# Patient Record
Sex: Male | Born: 1955 | Race: Black or African American | Hispanic: No | Marital: Married | Smoking: Current every day smoker
Health system: Southern US, Community
[De-identification: ages and names within clinical notes are randomized; demographics above are authoritative.]

## PROBLEM LIST (undated history)

## (undated) DIAGNOSIS — F209 Schizophrenia, unspecified: Secondary | ICD-10-CM

## (undated) DIAGNOSIS — F32A Depression, unspecified: Secondary | ICD-10-CM

## (undated) DIAGNOSIS — F329 Major depressive disorder, single episode, unspecified: Secondary | ICD-10-CM

## (undated) DIAGNOSIS — T148XXA Other injury of unspecified body region, initial encounter: Secondary | ICD-10-CM

## (undated) HISTORY — PX: INCISE AND DRAIN ABCESS: PRO64

---

## 2001-03-05 ENCOUNTER — Emergency Department (HOSPITAL_COMMUNITY): Admission: EM | Admit: 2001-03-05 | Discharge: 2001-03-05 | Payer: Self-pay | Admitting: *Deleted

## 2001-09-22 ENCOUNTER — Encounter: Payer: Self-pay | Admitting: Emergency Medicine

## 2001-09-22 ENCOUNTER — Emergency Department (HOSPITAL_COMMUNITY): Admission: EM | Admit: 2001-09-22 | Discharge: 2001-09-22 | Payer: Self-pay | Admitting: Emergency Medicine

## 2003-09-01 ENCOUNTER — Emergency Department (HOSPITAL_COMMUNITY): Admission: EM | Admit: 2003-09-01 | Discharge: 2003-09-01 | Payer: Self-pay | Admitting: Emergency Medicine

## 2003-09-05 ENCOUNTER — Ambulatory Visit (HOSPITAL_COMMUNITY): Admission: RE | Admit: 2003-09-05 | Discharge: 2003-09-05 | Payer: Self-pay | Admitting: Orthopaedic Surgery

## 2004-08-01 ENCOUNTER — Ambulatory Visit (HOSPITAL_COMMUNITY): Admission: RE | Admit: 2004-08-01 | Discharge: 2004-08-01 | Payer: Self-pay | Admitting: General Surgery

## 2008-09-14 ENCOUNTER — Emergency Department (HOSPITAL_COMMUNITY): Admission: EM | Admit: 2008-09-14 | Discharge: 2008-09-14 | Payer: Self-pay | Admitting: Emergency Medicine

## 2009-12-28 ENCOUNTER — Emergency Department (HOSPITAL_COMMUNITY): Admission: EM | Admit: 2009-12-28 | Discharge: 2009-12-28 | Payer: Self-pay | Admitting: Emergency Medicine

## 2010-02-15 ENCOUNTER — Emergency Department (HOSPITAL_COMMUNITY): Admission: EM | Admit: 2010-02-15 | Discharge: 2010-02-15 | Payer: Self-pay | Admitting: Emergency Medicine

## 2010-08-14 LAB — URINALYSIS, ROUTINE W REFLEX MICROSCOPIC
Glucose, UA: NEGATIVE mg/dL
Ketones, ur: 15 mg/dL — AB
Nitrite: NEGATIVE
Protein, ur: NEGATIVE mg/dL
Specific Gravity, Urine: 1.025 (ref 1.005–1.030)
Urobilinogen, UA: 2 mg/dL — ABNORMAL HIGH (ref 0.0–1.0)
pH: 5.5 (ref 5.0–8.0)

## 2010-10-17 NOTE — H&P (Signed)
NAME:  Nathaniel Park, Nathaniel Park                 ACCOUNT NO.:  [id number]   MEDICAL RECORD NO.:  [id number]          PATIENT TYPE:  AMB   LOCATION:  DAY                           FACILITY:  [facility]   PHYSICIAN:  Dirk Dress. Katrinka Blazing, M.D.   DATE OF BIRTH:  [birth year]   DATE OF ADMISSION:  DATE OF DISCHARGE:  LH                                HISTORY & PHYSICAL   HISTORY OF PRESENT ILLNESS:  A 55 year old male with a history of recurrent  perineal abscess.  The patient initially presented in early September of  2005 with a complaint of a chronic abscess of the perineum for greater than  1 year.  He had had incision and drainage x3.  He has been treated with  antibiotics.  The patient was advised to return to the office when the  office became inflamed.  He now presents with a 2-day history of a pointing  anterior perirectal abscess with extension to the median raphe of the base  of the penis.   PAST HISTORY:  He has no chronic illnesses.   MEDICATIONS:  Zyprexa 7.5 mg daily.   PAST SURGICAL HISTORY:  No prior surgery, except for local I&D.   SOCIAL HISTORY:  He is employed.  He smokes one pack of cigarettes per day.  He does not drink.  He does not use drugs.   PHYSICAL EXAMINATION:  VITAL SIGNS:  Blood pressure 110/70, pulse 72,  respirations 18, weight 180 pounds.  HEENT:  Unremarkable.  NECK:  Supple.  No JVD, bruit, adenopathy, or thyromegaly.  CHEST:  Clear to auscultation.  HEART:  Regular rate and rhythm without murmur, rub, or gallop.  ABDOMEN:  Soft, nontender.  No masses.  EXTREMITIES:  No clubbing, cyanosis, or edema.  RECTAL:  A large anterior perirectal abscess to the left of the midline,  extending to the base of the penis.  NEUROLOGIC:  No focal deficit.   IMPRESSION:  Anterior perirectal abscess.   PLAN:  The patient has been started on oral doxycycline.  He will had a wide  excision and drainage of the area under anesthesia, and will be continued on  oral doxycycline as an  outpatient.      LCS/MEDQ  D:  07/31/2004  T:  08/01/2004  Job:  [id number]   cc:   [facility] Short Stay [facility]

## 2010-10-17 NOTE — Op Note (Signed)
NAME:  JOSEJULIAN, TARANGO                 ACCOUNT NO.:  [id number]   MEDICAL RECORD NO.:  [id number]          PATIENT TYPE:  AMB   LOCATION:  DAY                           FACILITY:  [facility]   PHYSICIAN:  Dirk Dress. Katrinka Blazing, M.D.   DATE OF BIRTH:  1956/01/24   DATE OF PROCEDURE:  08/01/2004  DATE OF DISCHARGE:  08/01/2004                                 OPERATIVE REPORT   PREOPERATIVE DIAGNOSIS:  Perirectal abscess.   POSTOPERATIVE DIAGNOSIS:  Perirectal abscess.   PROCEDURE:  Wide excision and drainage of perirectal abscess.   SURGEON:  Elpidio Anis, M.D.   DESCRIPTION:  Under general anesthesia, the patient was positioned in high  lithotomy position.  The perirectal area and the genitalia were prepped and  draped in sterile field.  Incision was made in the abscess cavity, and  cultures were taken.  A wide elliptical incision was made around the large  perirectal abscess with the intention of fully unroofing be an abscess  cavity.  The inflammatory process extended from the anus anteriorly down to  the base of the scrotum.  All of this tissue was excised.  Finger  fractionation of some loculations was carried out.  The area was irrigated  and was then left open.  Dressing was placed.  The patient was placed in the supine position.  He was awakened without  difficulty and was then transferred to a stretcher and taken to the  postanesthetic care unit for monitoring.      LCS/MEDQ  D:  08/31/2004  T:  09/01/2004  Job:  [id number]

## 2014-08-20 ENCOUNTER — Other Ambulatory Visit (HOSPITAL_COMMUNITY): Payer: Self-pay | Admitting: Family Medicine

## 2014-08-20 DIAGNOSIS — M7989 Other specified soft tissue disorders: Secondary | ICD-10-CM

## 2014-08-21 ENCOUNTER — Ambulatory Visit (HOSPITAL_COMMUNITY)
Admission: RE | Admit: 2014-08-21 | Discharge: 2014-08-21 | Disposition: A | Discharge status: Home / Self Care | Payer: BLUE CROSS/BLUE SHIELD | Source: Ambulatory Visit | Attending: Family Medicine | Admitting: Family Medicine

## 2014-08-21 DIAGNOSIS — M7989 Other specified soft tissue disorders: Secondary | ICD-10-CM

## 2014-08-21 DIAGNOSIS — R220 Localized swelling, mass and lump, head: Secondary | ICD-10-CM | POA: Insufficient documentation

## 2018-04-15 ENCOUNTER — Encounter (HOSPITAL_COMMUNITY): Payer: Self-pay | Admitting: Emergency Medicine

## 2018-04-15 ENCOUNTER — Emergency Department (HOSPITAL_COMMUNITY)
Admission: EM | Admit: 2018-04-15 | Discharge: 2018-04-15 | Disposition: A | Discharge status: Home / Self Care | Payer: BLUE CROSS/BLUE SHIELD | Source: Home / Self Care | Attending: Emergency Medicine | Admitting: Emergency Medicine

## 2018-04-15 ENCOUNTER — Other Ambulatory Visit: Payer: Self-pay

## 2018-04-15 ENCOUNTER — Encounter (HOSPITAL_COMMUNITY): Payer: Self-pay

## 2018-04-15 ENCOUNTER — Inpatient Hospital Stay (HOSPITAL_COMMUNITY)
Admission: AD | Admit: 2018-04-15 | Discharge: 2018-04-19 | DRG: 885 | Disposition: A | Discharge status: Home / Self Care | Payer: BLUE CROSS/BLUE SHIELD | Source: Intra-hospital | Attending: Psychiatry | Admitting: Psychiatry

## 2018-04-15 DIAGNOSIS — F209 Schizophrenia, unspecified: Secondary | ICD-10-CM | POA: Diagnosis present

## 2018-04-15 DIAGNOSIS — F419 Anxiety disorder, unspecified: Secondary | ICD-10-CM | POA: Diagnosis not present

## 2018-04-15 DIAGNOSIS — R45851 Suicidal ideations: Secondary | ICD-10-CM | POA: Insufficient documentation

## 2018-04-15 DIAGNOSIS — F329 Major depressive disorder, single episode, unspecified: Secondary | ICD-10-CM | POA: Diagnosis present

## 2018-04-15 DIAGNOSIS — F331 Major depressive disorder, recurrent, moderate: Secondary | ICD-10-CM

## 2018-04-15 DIAGNOSIS — F32A Depression, unspecified: Secondary | ICD-10-CM

## 2018-04-15 DIAGNOSIS — F322 Major depressive disorder, single episode, severe without psychotic features: Secondary | ICD-10-CM | POA: Diagnosis not present

## 2018-04-15 DIAGNOSIS — F17200 Nicotine dependence, unspecified, uncomplicated: Secondary | ICD-10-CM

## 2018-04-15 DIAGNOSIS — G4700 Insomnia, unspecified: Secondary | ICD-10-CM | POA: Diagnosis present

## 2018-04-15 DIAGNOSIS — Z79899 Other long term (current) drug therapy: Secondary | ICD-10-CM | POA: Diagnosis not present

## 2018-04-15 DIAGNOSIS — F200 Paranoid schizophrenia: Secondary | ICD-10-CM | POA: Diagnosis present

## 2018-04-15 DIAGNOSIS — Z23 Encounter for immunization: Secondary | ICD-10-CM | POA: Diagnosis not present

## 2018-04-15 HISTORY — DX: Depression, unspecified: F32.A

## 2018-04-15 HISTORY — DX: Major depressive disorder, single episode, unspecified: F32.9

## 2018-04-15 LAB — COMPREHENSIVE METABOLIC PANEL
ALBUMIN: 4 g/dL (ref 3.5–5.0)
ALK PHOS: 71 U/L (ref 38–126)
ALT: 15 U/L (ref 0–44)
ANION GAP: 7 (ref 5–15)
AST: 19 U/L (ref 15–41)
BILIRUBIN TOTAL: 0.7 mg/dL (ref 0.3–1.2)
BUN: 8 mg/dL (ref 6–20)
CALCIUM: 8.9 mg/dL (ref 8.9–10.3)
CO2: 25 mmol/L (ref 22–32)
Chloride: 108 mmol/L (ref 98–111)
Creatinine, Ser: 1.15 mg/dL (ref 0.61–1.24)
GFR calc Af Amer: >60 mL/min (ref >60)
GFR calc non Af Amer: >60 mL/min (ref >60)
GLUCOSE: 87 mg/dL (ref 70–99)
POTASSIUM: 3.5 mmol/L (ref 3.5–5.1)
Sodium: 140 mmol/L (ref 135–145)
TOTAL PROTEIN: 7.3 g/dL (ref 6.5–8.1)

## 2018-04-15 LAB — CBC
HCT: 46 % (ref 39.0–52.0)
Hemoglobin: 15 g/dL (ref 13.0–17.0)
MCH: 32.3 pg (ref 26.0–34.0)
MCHC: 32.6 g/dL (ref 30.0–36.0)
MCV: 98.9 fL (ref 80.0–100.0)
PLATELETS: 175 10*3/uL (ref 150–400)
RBC: 4.65 MIL/uL (ref 4.22–5.81)
RDW: 14.7 % (ref 11.5–15.5)
WBC: 8.3 10*3/uL (ref 4.0–10.5)
nRBC: 0 % (ref 0.0–0.2)

## 2018-04-15 LAB — ETHANOL

## 2018-04-15 LAB — RAPID URINE DRUG SCREEN, HOSP PERFORMED
Amphetamines: NOT DETECTED
Barbiturates: NOT DETECTED
Benzodiazepines: NOT DETECTED
Cocaine: NOT DETECTED
Opiates: NOT DETECTED
Tetrahydrocannabinol: NOT DETECTED

## 2018-04-15 LAB — SALICYLATE LEVEL

## 2018-04-15 LAB — ACETAMINOPHEN LEVEL

## 2018-04-15 MED ORDER — HYDROXYZINE HCL 25 MG PO TABS: 25.0000 mg | ORAL_TABLET | Freq: Three times a day (TID) | ORAL | Status: DC | PRN

## 2018-04-15 MED ORDER — TRAZODONE HCL 50 MG PO TABS: 50.0000 mg | ORAL_TABLET | Freq: Every evening | ORAL | Status: DC | PRN

## 2018-04-15 MED ORDER — MAGNESIUM HYDROXIDE 400 MG/5ML PO SUSP: 30.0000 mL | Freq: Every day | ORAL | Status: DC | PRN

## 2018-04-15 MED ORDER — ALUM & MAG HYDROXIDE-SIMETH 200-200-20 MG/5ML PO SUSP: 30.0000 mL | ORAL | Status: DC | PRN

## 2018-04-15 MED ORDER — PERPHENAZINE 16 MG PO TABS
16.0000 mg | ORAL_TABLET | Freq: Every day | ORAL | Status: DC
  Administered 2018-04-15 – 2018-04-18 (×4): 16 mg via ORAL
  Filled 2018-04-15 (×5): qty 1
  Filled 2018-04-15: qty 4
  Filled 2018-04-15 (×2): qty 1

## 2018-04-15 MED ORDER — INFLUENZA VAC SPLIT QUAD 0.5 ML IM SUSY
0.5000 mL | PREFILLED_SYRINGE | INTRAMUSCULAR | Status: AC
  Administered 2018-04-17: 0.5 mL via INTRAMUSCULAR
  Filled 2018-04-15: qty 0.5

## 2018-04-15 MED ORDER — ACETAMINOPHEN 325 MG PO TABS: 650.0000 mg | ORAL_TABLET | Freq: Four times a day (QID) | ORAL | Status: DC | PRN

## 2018-04-15 NOTE — ED Notes (Signed)
ED Provider at bedside. 

## 2018-04-15 NOTE — ED Provider Notes (Signed)
[facility]-EMERGENCY DEPT Provider Note   CSN: [id number] Arrival date & time: 04/15/18  1531     History   Chief Complaint Chief Complaint  Patient presents with  . Suicidal    HPI Roma SchanzJoseph Stender is a 62 y.o. male.  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 He indicates is also anxious, and stressed that he may not be able to learn a new job. States hx depression for 20+ years, states compliant w home med. +trouble sleeping at night and decreased appetite. Denies pain or injury. No fever or chills. States saw psychiatrist, Dr Donell BeersPlovsky, today, who referred to ED for eval and possible inpatient treatment. + SI, thoughts of shooting self. Denies attempt at self harm. Denies etoh or substance abuse.   The history is provided by the patient and the spouse.    History reviewed. No pertinent past medical history.  There are no active problems to display for this patient.   History reviewed. No pertinent surgical history.      Home Medications    Prior to Admission medications   Not on File    Family History No family history on file.  Social History Social History   Tobacco Use  . Smoking status: Current Every Day Smoker    Types: Cigarettes  . Smokeless tobacco: Never Used  Substance Use Topics  . Alcohol use: Not Currently  . Drug use: Not Currently     Allergies   Patient has no known allergies.   Review of Systems Review of Systems  Constitutional: Negative for fever.  HENT: Negative for sore throat.   Eyes: Negative for redness.  Respiratory: Negative for shortness of breath.   Cardiovascular: Negative for chest pain.  Gastrointestinal: Negative for abdominal pain.  Genitourinary: Negative for flank pain.  Musculoskeletal: Negative for back pain and neck pain.  Skin: Negative for rash.  Neurological: Negative for headaches.  Hematological: Does not bruise/bleed  easily.  Psychiatric/Behavioral: Positive for dysphoric mood and suicidal ideas. The patient is nervous/anxious.      Physical Exam Updated Vital Signs BP (!) 152/99 (BP Location: Right Arm)   Pulse 67   Temp (!) 97.4 F (36.3 C) (Oral)   Resp 18   SpO2 100%   Physical Exam  Constitutional: He is oriented to person, place, and time. He appears well-developed and well-nourished.  HENT:  Head: Atraumatic.  Mouth/Throat: Oropharynx is clear and moist.  Eyes: Pupils are equal, round, and reactive to light. Conjunctivae are normal.  Neck: Neck supple. No tracheal deviation present.  Cardiovascular: Normal rate, regular rhythm, normal heart sounds and intact distal pulses. Exam reveals no gallop and no friction rub.  No murmur heard. Pulmonary/Chest: Effort normal and breath sounds normal. No accessory muscle usage. No respiratory distress.  Abdominal: Soft. Bowel sounds are normal. He exhibits no distension. There is no tenderness.  Musculoskeletal: He exhibits no edema or tenderness.  Neurological: He is alert and oriented to person, place, and time.  Speech clear/fluent. Ambulates w steady gait.   Skin: Skin is warm and dry. No rash noted.  Psychiatric:  Depressed mood. +SI.  Nursing note and vitals reviewed.    ED Treatments / Results  Labs (all labs ordered are listed, but only abnormal results are displayed) Results for orders placed or performed during the hospital encounter of 04/15/18  Comprehensive metabolic panel  Result Value Ref Range   Sodium 140 135 - 145 mmol/L  Potassium 3.5 3.5 - 5.1 mmol/L   Chloride 108 98 - 111 mmol/L   CO2 25 22 - 32 mmol/L   Glucose, Bld 87 70 - 99 mg/dL   BUN 8 6 - 20 mg/dL   Creatinine, Ser 1.19 0.61 - 1.24 mg/dL   Calcium 8.9 8.9 - 14.7 mg/dL   Total Protein 7.3 6.5 - 8.1 g/dL   Albumin 4.0 3.5 - 5.0 g/dL   AST 19 15 - 41 U/L   ALT 15 0 - 44 U/L   Alkaline Phosphatase 71 38 - 126 U/L   Total Bilirubin 0.7 0.3 - 1.2 mg/dL    GFR calc non Af Amer >60 >60 mL/min   GFR calc Af Amer >60 >60 mL/min   Anion gap 7 5 - 15  Ethanol  Result Value Ref Range   Alcohol, Ethyl (B) <10 <10 mg/dL  Salicylate level  Result Value Ref Range   Salicylate Lvl <7.0 2.8 - 30.0 mg/dL  Acetaminophen level  Result Value Ref Range   Acetaminophen (Tylenol), Serum <10 (L) 10 - 30 ug/mL  cbc  Result Value Ref Range   WBC 8.3 4.0 - 10.5 K/uL   RBC 4.65 4.22 - 5.81 MIL/uL   Hemoglobin 15.0 13.0 - 17.0 g/dL   HCT 82.9 56.2 - 13.0 %   MCV 98.9 80.0 - 100.0 fL   MCH 32.3 26.0 - 34.0 pg   MCHC 32.6 30.0 - 36.0 g/dL   RDW 86.5 78.4 - 69.6 %   Platelets 175 150 - 400 K/uL   nRBC 0.0 0.0 - 0.2 %  Rapid urine drug screen (hospital performed)  Result Value Ref Range   Opiates NONE DETECTED NONE DETECTED   Cocaine NONE DETECTED NONE DETECTED   Benzodiazepines NONE DETECTED NONE DETECTED   Amphetamines NONE DETECTED NONE DETECTED   Tetrahydrocannabinol NONE DETECTED NONE DETECTED   Barbiturates NONE DETECTED NONE DETECTED   EKG None  Radiology No results found.  Procedures Procedures (including critical care time)  Medications Ordered in ED Medications - No data to display   Initial Impression / Assessment and Plan / ED Course  I have reviewed the triage vital signs and the nursing notes.  Pertinent labs & imaging results that were available during my care of the patient were reviewed by me and considered in my medical decision making (see chart for details).  Labs sent.  BH team consulted.  Reviewed nursing notes and prior charts for additional history.   Dr Donell Beers told RN that NH team could contact him at [phone] if questions.   Labs reviewed - chemistries normal.  BH eval pending.  Disposition per [facility] team.     Final Clinical Impressions(s) / ED Diagnoses   Final diagnoses:  None    ED Discharge Orders    None       Cathren Laine, MD 04/15/18 1721

## 2018-04-15 NOTE — BH Assessment (Signed)
[facility] Assessment Progress Note [facility] at [phone] patient has been accepted to 404-1 at 2100 hours. Paper to be completed patient was noted to be sleeping as this Clinical research associatewriter attempts to have patient sign.

## 2018-04-15 NOTE — ED Notes (Signed)
Bed: WA26 Expected date:  Expected time:  Means of arrival:  Comments: 

## 2018-04-15 NOTE — BH Assessment (Signed)
Assessment Note  Nathaniel Park is an 62 y.o. male that presents this date voluntary with S/I. Patient reports he has a plan to harm himself with a firearm that he has at his residence. Patient's wife is present who provides collateral information with patient's consent. Per wife, that firearm has been secured. Patient displays some active thought blocking and is slow to respond to this writer's questions. Patient is time place oriented x 4 with prompting. Patient denies any H/I or VH although reports active AH stating he has been hearing voices that are command in nature telling him to kill himself. Patient states he was diagnosed with Schizophrenia over 30 years ago and denies any previous attempts/gestures at self harm. Patient states he has been receiving OP services for the last twenty years from [facility] who assist with medication management. Patient reports the depression and AH have worsened after he was released from his employer [employer]) on October 29 th after working there for over 30 years. Patient reports current compliance with his medication regimen. Patient's wife stated she transported patient to [facility] MD this date when patient voiced S/I earlier. Provider recommended patient be evaluated at [facility] this date. Patient reports ongoing symptoms of depression to include: decreased sleep (4 hours or less for the last two weeks), isolating and feeling hopeless. Patient denies any current/past SA use. UDS pending. Patient's memory is recently impaired and finds it difficult to render his history patient is speaking in a low soft voice and is difficult to understand at times. Patient cannot contact for safety and is requesting a admission to assist with stabilization. Per notes, patient reports that he was working at [employer] and it shut down back on October. Reports that he cant keep a new job due to not being able to learn the new job so is currently unemployed. Patient's OP provider that he  has seen for 20 years referred to ED for SI with plan on shooting himself. Case was staffed with Arville Care FNP who recommended a inpatient admission to assist with stabilization.    Diagnosis: F20.9 Schizophrenia   Past Medical History: History reviewed. No pertinent past medical history.  History reviewed. No pertinent surgical history.  Family History: No family history on file.  Social History:  reports that he has been smoking cigarettes. He has never used smokeless tobacco. He reports that he drank alcohol. He reports that he has current or past drug history.  Additional Social History:  Alcohol / Drug Use Pain Medications: See MAR Prescriptions: See MAR Over the Counter: See MAR History of alcohol / drug use?: No history of alcohol / drug abuse Longest period of sobriety (when/how long): NA Negative Consequences of Use: (NA) Withdrawal Symptoms: (NA)  CIWA: CIWA-Ar BP: (!) 152/99 Pulse Rate: 67 COWS:    Allergies: No Known Allergies  Home Medications:  (Not in a hospital admission)  OB/GYN Status:  No LMP for male patient.  General Assessment Data [facility] of [facility]: [facility] TTS Assessment: In system Is this a Tele or Face-to-Face Assessment?: Face-to-Face Is this an Initial Assessment or a Re-assessment for this encounter?: Initial Assessment Patient Accompanied by:: (Wife) Language Other than English: No Living Arrangements: (With wife) What gender do you identify as?: Male Marital status: Married [place] name: NA Pregnancy Status: No Living Arrangements: Spouse/significant other Can pt return to current living arrangement?: Yes Admission Status: Voluntary Is patient capable of signing voluntary admission?: Yes Referral Source: Self/Family/Friend Insurance type: Scientist, research (physical sciences) Exam [facility] Walk-in  ONLY) Medical Exam completed: Yes  Crisis Care Plan Living Arrangements: Spouse/significant other Legal Guardian: (NA) Name of Psychiatrist: [facility]  MD Name of Therapist: None  Education Status Is patient currently in school?: No Is the patient employed, unemployed or receiving disability?: Unemployed  Risk to self with the past 6 months Suicidal Ideation: Yes-Currently Present Has patient been a risk to self within the past 6 months prior to admission? : No Suicidal Intent: Yes-Currently Present Has patient had any suicidal intent within the past 6 months prior to admission? : No Is patient at risk for suicide?: Yes Suicidal Plan?: Yes-Currently Present Has patient had any suicidal plan within the past 6 months prior to admission? : No Specify Current Suicidal Plan: Firearm Access to Means: Yes Specify Access to Suicidal Means: Pt has firearm What has been your use of drugs/alcohol within the last 12 months?: Denies Previous Attempts/Gestures: No How many times?: 0 Other Self Harm Risks: Excessive stress  Triggers for Past Attempts: Unknown Intentional Self Injurious Behavior: None Family Suicide History: No Recent stressful life event(s): Job Loss Persecutory voices/beliefs?: No Depression: Yes Depression Symptoms: Feeling worthless/self pity Substance abuse history and/or treatment for substance abuse?: No Suicide prevention information given to non-admitted patients: Not applicable  Risk to Others within the past 6 months Homicidal Ideation: No Does patient have any lifetime risk of violence toward others beyond the six months prior to admission? : No Thoughts of Harm to Others: No Current Homicidal Intent: No Current Homicidal Plan: No Access to Homicidal Means: No Identified Victim: NA History of harm to others?: No Assessment of Violence: None Noted Violent Behavior Description: NA Does patient have access to weapons?: No Criminal Charges Pending?: No Does patient have a court date: No Is patient on probation?: No  Psychosis Hallucinations: Auditory Delusions: None noted  Mental Status  Report Appearance/Hygiene: In scrubs Eye Contact: Fair Motor Activity: Freedom of movement Speech: Soft, Slow Level of Consciousness: Alert Mood: Depressed Affect: Sad Anxiety Level: Minimal Thought Processes: Thought Blocking Judgement: Partial Orientation: Person, Place, Time Obsessive Compulsive Thoughts/Behaviors: None  Cognitive Functioning Concentration: Decreased Memory: Recent Impaired Is patient IDD: No Insight: Fair Impulse Control: Poor Appetite: Fair Have you had any weight changes? : No Change Sleep: Decreased Total Hours of Sleep: 4 Vegetative Symptoms: None  ADLScreening [facility] Assessment Services) Patient's cognitive ability adequate to safely complete daily activities?: Yes Patient able to express need for assistance with ADLs?: Yes Independently performs ADLs?: Yes (appropriate for developmental age)  Prior Inpatient Therapy Prior Inpatient Therapy: No  Prior Outpatient Therapy Prior Outpatient Therapy: Yes Prior Therapy Dates: Ongoing Prior Therapy Facilty/Provider(s): [facility] Reason for Treatment: Med mang Does patient have an ACCT team?: No Does patient have Intensive In-House Services?  : No Does patient have [facility] services? : No Does patient have P4CC services?: No  ADL Screening (condition at time of admission) Patient's cognitive ability adequate to safely complete daily activities?: Yes Is the patient deaf or have difficulty hearing?: No Does the patient have difficulty seeing, even when wearing glasses/contacts?: No Does the patient have difficulty concentrating, remembering, or making decisions?: No Patient able to express need for assistance with ADLs?: Yes Does the patient have difficulty dressing or bathing?: No Independently performs ADLs?: Yes (appropriate for developmental age) Does the patient have difficulty walking or climbing stairs?: No Weakness of Legs: None Weakness of Arms/Hands: None  Home Assistive  Devices/Equipment Home Assistive Devices/Equipment: None  Therapy Consults (therapy consults require a physician order) PT Evaluation Needed: No  OT Evalulation Needed: No SLP Evaluation Needed: No Abuse/Neglect Assessment (Assessment to be complete while patient is alone) Physical Abuse: Denies Verbal Abuse: Denies Sexual Abuse: Denies Exploitation of patient/patient's resources: Denies Self-Neglect: Denies Values / Beliefs Cultural Requests During Hospitalization: None Spiritual Requests During Hospitalization: None Consults Spiritual Care Consult Needed: No Social Work Consult Needed: No Merchant navy officerAdvance Directives (For Healthcare) Does Patient Have a Medical Advance Directive?: No Would patient like information on creating a medical advance directive?: No - Patient declined          Disposition: Case was staffed with Arville CareParks FNP who recommended a inpatient admission to assist with stabilization.    Disposition Initial Assessment Completed for this Encounter: Yes Disposition of Patient: Admit Type of inpatient treatment program: Adult Patient refused recommended treatment: No Mode of transportation if patient is discharged?: (Unk)  On Site Evaluation by:   Reviewed with Physician:    Alfredia Fergusonavid L Heidee Audi 04/15/2018 5:17 PM

## 2018-04-15 NOTE — Tx Team (Signed)
Initial Treatment Plan 04/15/2018 11:58 PM Nathaniel SchanzJoseph Park [id number]    PATIENT STRESSORS: Financial difficulties Loss of job recently   PATIENT STRENGTHS: Ability for insight Average or above average intelligence Capable of independent living Motivation for treatment/growth Supportive family/friends   PATIENT IDENTIFIED PROBLEMS: Depression  Anxiety  SI      " I don't have a goal. "           DISCHARGE CRITERIA:  Improved stabilization in mood, thinking, and/or behavior  PRELIMINARY DISCHARGE PLAN: Return to previous living arrangement  PATIENT/FAMILY INVOLVEMENT: This treatment plan has been presented to and reviewed with the patient, Nathaniel Park, and/or family member.  The patient and family have been given the opportunity to ask questions and make suggestions.  Floyce StakesGarrison, Pedro Whiters, RN 04/15/2018, 11:58 PM

## 2018-04-15 NOTE — ED Notes (Signed)
Bed: [facility]3 Expected date:  Expected time:  Means of arrival:  Comments: 

## 2018-04-15 NOTE — ED Triage Notes (Signed)
Pt reports that he was working at [employer] and it shut down back on October. Reports that he cant keep a new job due to not being able to learn the new job so is currently unemployed.  Pt therapist that has seen for 20 years referred to ED for SI with plan on shooting himself. Pt states that he does own guns.

## 2018-04-15 NOTE — ED Notes (Signed)
Bed: [facility]PT2 Expected date:  Expected time:  Means of arrival:  Comments: 

## 2018-04-15 NOTE — BH Assessment (Signed)
BHH Assessment Progress Note  Case was staffed with Parks FNP who recommended a inpatient admission to assist with stabilization.     

## 2018-04-15 NOTE — ED Notes (Signed)
Pt states family took all his belongings home when he was admitted

## 2018-04-16 DIAGNOSIS — F322 Major depressive disorder, single episode, severe without psychotic features: Secondary | ICD-10-CM

## 2018-04-16 DIAGNOSIS — G4700 Insomnia, unspecified: Secondary | ICD-10-CM

## 2018-04-16 DIAGNOSIS — F419 Anxiety disorder, unspecified: Secondary | ICD-10-CM

## 2018-04-16 MED ORDER — NICOTINE 21 MG/24HR TD PT24
21.0000 mg | MEDICATED_PATCH | Freq: Every day | TRANSDERMAL | Status: DC
  Administered 2018-04-16 – 2018-04-19 (×4): 21 mg via TRANSDERMAL
  Filled 2018-04-16 (×6): qty 1

## 2018-04-16 MED ORDER — VENLAFAXINE HCL ER 37.5 MG PO CP24
37.5000 mg | ORAL_CAPSULE | Freq: Every day | ORAL | Status: DC
  Administered 2018-04-16 – 2018-04-18 (×3): 37.5 mg via ORAL
  Filled 2018-04-16 (×6): qty 1

## 2018-04-16 MED ORDER — LORAZEPAM 0.5 MG PO TABS: 0.5000 mg | ORAL_TABLET | Freq: Four times a day (QID) | ORAL | Status: DC | PRN

## 2018-04-16 NOTE — Progress Notes (Signed)
Patient ID: Roma SchanzJoseph Eastlick, male   DOB: Jul 15, 1955, 62 y.o.   MRN: [id number]  [facility]:  (Nursing/MHT/Case Management/Adjunct)  Date:  04/16/2018  Time:  1315   Type of Therapy:  Nurse Education  Participation Level:  Active  Participation Quality:  Appropriate  Affect:  Appropriate  Cognitive:  Alert and Oriented  Insight:  Improving  Engagement in Group:  Improving  Modes of Intervention:  Activity, Discussion, Education and Support  Summary of Progress/Problems:   Kennyth LoseLandis, Hao Dion E 04/16/2018, 1:29 PM

## 2018-04-16 NOTE — Progress Notes (Signed)
The patient verbalized in group that he addressed his anxiety today and that he had fun as he went to the gym for recreation. His goal for tomorrow is to attend more groups and work on his anxiety.

## 2018-04-16 NOTE — Progress Notes (Signed)
D Pt UAL on the 400 hall today. HE toelrates this well. HE is depressed and sad.    A HE completed his duily assessment andnon this he wrote he deneid SI today and he rated his depressin, hopelessness and anxeity " 7/3/3/", respectively.    R Safety is in place.
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
## 2018-04-16 NOTE — H&P (Signed)
Psychiatric Admission Assessment Adult  Patient Identification: Nathaniel Park MRN:  [id number] Date of Evaluation:  04/16/2018 Chief Complaint: " I am about to lose everything I got" Principal Diagnosis: MDD, no psychotic features  Diagnosis:   Patient Active Problem List   Diagnosis Date Noted  . Schizophrenia ([place]) [F20.9] 04/15/2018   History of Present Illness: 62 year old married male, presented to the ED voluntarily  at the recommendation of his outpatient psychiatrist. He reports worsening depression over the last several weeks. Attributes depression in part to unemployment. States he had a job for many years until the [employer] he worked at closed down recently. He tried to get another job but states he was unable to do it. " I could not concentrate, I could not do it". Reports neuro-vegetative symptoms of depression, with thoughts of shooting self . Endorses neuro-vegetative symptoms as below. At this time patient denies hallucinations, and does not appear internally preoccupied, but did endorse auditory hallucinations during ED evaluation.   Associated Signs/Symptoms: Depression Symptoms:  depressed mood, anhedonia, insomnia, suicidal thoughts with specific plan, anxiety, loss of energy/fatigue, (Hypo) Manic Symptoms:  None noted or endorsed  Anxiety Symptoms: reports increased anxiety recently  Psychotic Symptoms:  Denies any recent hallucinations, does endorse remote history of auditory hallucinations and states he was diagnosed with Schizophrenia in the past . PTSD Symptoms: Denies  Total Time spent with patient: 45 minutes  Past Psychiatric History: history of two prior psychiatric admissions, most recently about 25 years ago. 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 Has never attempted suicide, denies history of self injurious behaviors .  Reports history of depressive episodes in the past, but not as severe  as current. Denies history of mania, denies history of panic or agoraphobia. Denies history of violence .    Is the patient at risk to self? Yes.    Has the patient been a risk to self in the past 6 months? Yes.    Has the patient been a risk to self within the distant past? No.  Is the patient a risk to others? No.  Has the patient been a risk to others in the past 6 months? No.  Has the patient been a risk to others within the distant past? No.   Prior Inpatient Therapy:  as above  Prior Outpatient Therapy:  he follows with Dr. Casimiro Needle in [place]  Alcohol Screening: 1. How often do you have a drink containing alcohol?: Never 2. How many drinks containing alcohol do you have on a typical day when you are drinking?: 1 or 2 3. How often do you have six or more drinks on one occasion?: Never AUDIT-C Score: 0 4. How often during the last year have you found that you were not able to stop drinking once you had started?: Never 5. How often during the last year have you failed to do what was normally expected from you becasue of drinking?: Never 6. How often during the last year have you needed a first drink in the morning to get yourself going after a heavy drinking session?: Never 7. How often during the last year have you had a feeling of guilt of remorse after drinking?: Never 8. How often during the last year have you been unable to remember what happened the night before because you had been drinking?: Never 9. Have you or someone else been injured as a result of your drinking?: No 10. Has a  relative or friend or a doctor or another health worker been concerned about your drinking or suggested you cut down?: No Alcohol Use Disorder Identification Test Final Score (AUDIT): 0 Intervention/Follow-up: AUDIT Score <7 follow-up not indicated Substance Abuse History in the last 12 months: denies alcohol or drug abuse  Consequences of Substance Abuse: Denies  Previous Psychotropic  Medications: Trilafon 16 mgrs QHS. States he has been taking for several years. States takes it regularly. Remembers having been on Haldol years ago. States he has never been on antidepressant medications.  Psychological Evaluations:  No Past Medical History:  Denies history of medical illnesses, NKDA. Past Medical History:  Diagnosis Date  . Depression    History reviewed. No pertinent surgical history. Family History: parents deceased, mother died from complications of DM, father died when patient was a child. Has 2 brothers , 2 sisters  Family Psychiatric  History: denies  history of mental illness in family, no suicides in family, father was alcoholic  Tobacco Screening:  Smokes 1 PPD Social History: 45, married, has one adult children,currently unemployed.  Social History   Substance and Sexual Activity  Alcohol Use Not Currently     Social History   Substance and Sexual Activity  Drug Use Not Currently    Additional Social History:  Allergies:  No Known Allergies Lab Results:  Results for orders placed or performed during the hospital encounter of 04/15/18 (from the past 48 hour(s))  Rapid urine drug screen (hospital performed)     Status: None   Collection Time: 04/15/18  3:53 PM  Result Value Ref Range   Opiates NONE DETECTED NONE DETECTED   Cocaine NONE DETECTED NONE DETECTED   Benzodiazepines NONE DETECTED NONE DETECTED   Amphetamines NONE DETECTED NONE DETECTED   Tetrahydrocannabinol NONE DETECTED NONE DETECTED   Barbiturates NONE DETECTED NONE DETECTED    Comment: (NOTE) DRUG SCREEN FOR MEDICAL PURPOSES ONLY.  IF CONFIRMATION IS NEEDED FOR ANY PURPOSE, NOTIFY LAB WITHIN 5 DAYS. LOWEST DETECTABLE LIMITS FOR URINE DRUG SCREEN Drug Class                     Cutoff (ng/mL) Amphetamine and metabolites    1000 Barbiturate and metabolites    200 Benzodiazepine                 861 Tricyclics and metabolites     300 Opiates and metabolites        300 Cocaine and  metabolites        300 THC                            50 Performed at [facility], [place] [address], [place], [place] [address]   Comprehensive metabolic panel     Status: None   Collection Time: 04/15/18  4:32 PM  Result Value Ref Range   Sodium 140 135 - 145 mmol/L   Potassium 3.5 3.5 - 5.1 mmol/L   Chloride 108 98 - 111 mmol/L   CO2 25 22 - 32 mmol/L   Glucose, Bld 87 70 - 99 mg/dL   BUN 8 6 - 20 mg/dL   Creatinine, Ser 1.15 0.61 - 1.24 mg/dL   Calcium 8.9 8.9 - 10.3 mg/dL   Total Protein 7.3 6.5 - 8.1 g/dL   Albumin 4.0 3.5 - 5.0 g/dL   AST 19 15 - 41 U/L   ALT 15 0 - 44 U/L  Alkaline Phosphatase 71 38 - 126 U/L   Total Bilirubin 0.7 0.3 - 1.2 mg/dL   GFR calc non Af Amer >60 >60 mL/min   GFR calc Af Amer >60 >60 mL/min    Comment: (NOTE) The eGFR has been calculated using the CKD EPI equation. This calculation has not been validated in all clinical situations. eGFR's persistently <60 mL/min signify possible Chronic Kidney Disease.    Anion gap 7 5 - 15    Comment: Performed at [facility], [place] [address], [place], [place] [address]  Ethanol     Status: None   Collection Time: 04/15/18  4:32 PM  Result Value Ref Range   Alcohol, Ethyl (B) <10 <10 mg/dL    Comment: (NOTE) Lowest detectable limit for serum alcohol is 10 mg/dL. For medical purposes only. Performed at [facility], [place] [address], [place], [place] [address]   Salicylate level     Status: None   Collection Time: 04/15/18  4:32 PM  Result Value Ref Range   Salicylate Lvl <6.3 2.8 - 30.0 mg/dL    Comment: Performed at [facility], [place] [address], [place], [place] [address]  Acetaminophen level     Status: Abnormal   Collection Time: 04/15/18  4:32 PM  Result Value Ref Range   Acetaminophen (Tylenol), Serum <10 (L) 10 - 30 ug/mL    Comment: (NOTE) Therapeutic concentrations vary significantly. A range of 10-30 ug/mL  may be  an effective concentration for many patients. However, some  are best treated at concentrations outside of this range. Acetaminophen concentrations >150 ug/mL at 4 hours after ingestion  and >50 ug/mL at 12 hours after ingestion are often associated with  toxic reactions. Performed at [facility], [place] [address] [place], [place] [address]   cbc     Status: None   Collection Time: 04/15/18  4:32 PM  Result Value Ref Range   WBC 8.3 4.0 - 10.5 K/uL   RBC 4.65 4.22 - 5.81 MIL/uL   Hemoglobin 15.0 13.0 - 17.0 g/dL   HCT 46.0 39.0 - 52.0 %   MCV 98.9 80.0 - 100.0 fL   MCH 32.3 26.0 - 34.0 pg   MCHC 32.6 30.0 - 36.0 g/dL   RDW 14.7 11.5 - 15.5 %   Platelets 175 150 - 400 K/uL   nRBC 0.0 0.0 - 0.2 %    Comment: Performed at [facility], [place] [address], [place], [place] [address]    Blood Alcohol level:  Lab Results  Component Value Date   ETH <10 27/74/1287    Metabolic Disorder Labs:  No results found for: HGBA1C, MPG No results found for: PROLACTIN No results found for: CHOL, TRIG, HDL, CHOLHDL, VLDL, LDLCALC  Current Medications: Current Facility-Administered Medications  Medication Dose Route Frequency Provider Last Rate Last Dose  . acetaminophen (TYLENOL) tablet 650 mg  650 mg Oral Q6H PRN Money, Lowry Ram, FNP      . alum & mag hydroxide-simeth (MAALOX/MYLANTA) 200-200-20 MG/5ML suspension 30 mL  30 mL Oral Q4H PRN Money, Lowry Ram, FNP      . hydrOXYzine (ATARAX/VISTARIL) tablet 25 mg  25 mg Oral TID PRN Money, Lowry Ram, FNP      . Influenza vac split quadrivalent PF (FLUARIX) injection 0.5 mL  0.5 mL Intramuscular Tomorrow-1000 Ashvik Grundman A, MD      . magnesium hydroxide (MILK OF MAGNESIA) suspension 30 mL  30 mL Oral Daily PRN  Money, Lowry Ram, FNP      . nicotine (NICODERM CQ - dosed in mg/24 hours) patch 21 mg  21 mg Transdermal Q0600 Patriciaann Clan E, PA-C      . perphenazine (TRILAFON) tablet 16 mg  16 mg Oral QHS Money,  Lowry Ram, FNP   16 mg at 04/15/18 2337  . traZODone (DESYREL) tablet 50 mg  50 mg Oral QHS PRN Money, Lowry Ram, FNP       PTA Medications: Medications Prior to Admission  Medication Sig Dispense Refill Last Dose  . clobetasol ointment (TEMOVATE) 5.49 % Apply 1 application topically daily.   6 04/14/2018 at Unknown time  . perphenazine (TRILAFON) 16 MG tablet Take 16 mg by mouth at bedtime.  6 04/14/2018 at Unknown time    Musculoskeletal: Strength & Muscle Tone: within normal limits Gait & Station: normal Patient leans: N/A  Psychiatric Specialty Exam: Physical Exam  Review of Systems  Constitutional: Negative.   HENT: Negative.   Eyes: Negative.   Respiratory: Negative.   Cardiovascular: Negative.   Gastrointestinal: Negative for diarrhea, nausea and vomiting.  Genitourinary: Negative.   Skin: Negative.   Neurological: Negative for seizures and headaches.  Endo/Heme/Allergies: Negative.   Psychiatric/Behavioral: Positive for depression and suicidal ideas.  All other systems reviewed and are negative.   Resp. rate 18, height 6' (1.829 m), weight 74.8 kg.Body mass index is 22.38 kg/m.  General Appearance: Fairly Groomed  Eye Contact:  Fair  Speech:  Normal Rate  Volume:  Decreased  Mood:  Depressed  Affect:  Congruent and Constricted  Thought Process:  Linear and Descriptions of Associations: Intact- thought process slowed   Orientation:  Full (Time, Place, and Person)  Thought Content:  denies hallucinations, no delusions expressed, not internally preoccupied   Suicidal Thoughts:  No denies current suicidal ideations, contracts for safety on unit, denies homicidal or violent ideations  Homicidal Thoughts:  No  Memory:  recent and remote grossly intact   Judgement:  Fair  Insight:  Fair  Psychomotor Activity:  Decreased  Concentration:  Concentration: Good and Attention Span: Good  Recall:  Good  Fund of Knowledge:  Good  Language:  Good  Akathisia:  Negative   Handed:  Right  AIMS (if indicated):     Assets:  Communication Skills Desire for Improvement Resilience  ADL's:  Intact  Cognition:  WNL  Sleep:  Number of Hours: 5    Treatment Plan Summary: Daily contact with patient to assess and evaluate symptoms and progress in treatment, Medication management, Plan inpatient treatment  and medications as below  Observation Level/Precautions:  15 minute checks  Laboratory:  as needed Lipid panel, HgbA1C, EKG, TSH  Psychotherapy:  Milieu, group therapy   Medications: we discussed options- he is not currently actively psychotic , but states he has been on Trilafon for a past history of Schizophrenia for many years . Denies side effects from it. Agrees to antidepressant trial. Start Effexor XR 37.5 mgrs - side effects reviewed Fow now continue Trilafon at his home dose of 16 mgrs QDAY- would consider tapering dose down  slowly/gradually     Consultations:  As needed   Discharge Concerns:    Estimated LOS: 5 days   Other:     Physician Treatment Plan for Primary Diagnosis:  MDD, severe, no psychotic features  Long Term Goal(s): Improvement in symptoms so as ready for discharge  Short Term Goals: Ability to identify changes in lifestyle to reduce recurrence of condition will improve  and Ability to maintain clinical measurements within normal limits will improve  Physician Treatment Plan for Secondary Diagnosis: Schizophrenia by history Long Term Goal(s): Improvement in symptoms so as ready for discharge  Short Term Goals: Ability to identify changes in lifestyle to reduce recurrence of condition will improve and Ability to maintain clinical measurements within normal limits will improve  I certify that inpatient services furnished can reasonably be expected to improve the patient's condition.    Jenne Campus, MD 11/16/20198:19 AM

## 2018-04-16 NOTE — [facility] Suicide Risk Assessment (Signed)
[facility]   Nursing information obtained from:  Patient Demographic factors:  Male, Unemployed, Access to firearms Current Mental Status:  Suicidal ideation indicated by patient Loss Factors:  Decrease in vocational status, Financial problems / change in socioeconomic status Historical Factors:  NA Risk Reduction Factors:  Sense of responsibility to family, Living with another person, especially a relative  Total Time spent with patient: 45 minutes Principal Problem:  MDD, Schizophrenia by history  Diagnosis:   Patient Active Problem List   Diagnosis Date Noted  . Schizophrenia ([facility]) [F20.9] 04/15/2018   Subjective Data:   Continued Clinical Symptoms:  Alcohol Use Disorder Identification Test Final Score (AUDIT): 0 The "Alcohol Use Disorders Identification Test", Guidelines for Use in Primary Care, Second Edition.  World Science writerHealth Organization [facility](WHO). Score between 0-7:  no or low risk or alcohol related problems. Score between 8-15:  moderate risk of alcohol related problems. Score between 16-19:  high risk of alcohol related problems. Score 20 or above:  warrants further diagnostic evaluation for alcohol dependence and treatment.   CLINICAL FACTORS:   62 year old married male, presents for depression, neuro-vegetative symptoms, suicidal ideations of shooting self , triggered by recent loss of a job he had for many years. He has been diagnosed with Schizophrenia in the past, and has history of remote psychiatric admissions ( 25 years ago) for psychotic symptoms, but currently denies hallucinations, does not present with thought disorder. He has been on Trilafon for years , denies side effects.    Psychiatric Specialty Exam: Physical Exam  ROS  Resp. rate 18, height 6' (1.829 m), weight 74.8 kg.Body mass index is 22.38 kg/m.   see admit note MSE   COGNITIVE FEATURES THAT CONTRIBUTE TO RISK:  Closed-mindedness and Loss of executive function    SUICIDE  RISK:   Moderate:  Frequent suicidal ideation with limited intensity, and duration, some specificity in terms of plans, no associated intent, good self-control, limited dysphoria/symptomatology, some risk factors present, and identifiable protective factors, including available and accessible social support.  PLAN OF CARE: Patient will be admitted to inpatient psychiatric unit for stabilization and safety. Will provide and encourage milieu participation. Provide medication management and maked adjustments as needed.  Will follow daily.    I certify that inpatient services furnished can reasonably be expected to improve the patient's condition.   Craige CottaFernando A Shariff Lasky, MD 04/16/2018, 8:50 AM

## 2018-04-16 NOTE — Plan of Care (Signed)
  Problem: Education: Goal: Knowledge of [facility] Education information/materials will improve Outcome: Progressing   

## 2018-04-16 NOTE — Progress Notes (Signed)
D: Pt was in dayroom with visitor upon initial approach.  Pt presents with appropriate affect and mood.  He reports feeling "a lot better."  His goal is "working on betting better so I can get out of here."  Pt reports he had a good visit with his wife, son, and daughter in [employer].  Pt denies SI/HI, denies hallucinations, denies pain.  Pt has been visible in milieu interacting with peers and staff appropriately.  Pt attended evening group.    A: Introduced self to pt.  Met with pt 1:1.  Actively listened to pt and offered support and encouragement.  Medication administered per order.  Q15 minute safety checks maintained.  R: Pt is safe on the unit.  Pt is compliant with medication.  Pt verbally contracts for safety.  Will continue to monitor and assess.

## 2018-04-16 NOTE — Progress Notes (Signed)
Patient was admitted voluntarily for depression. 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 He reported that he got a job and worked 1 week and he was terminated. He reports that no one is going to hire him at his age. He reports that he was having thoughts to shoot himself. His doctor is Dr. Plovsky who is his psychiatDonell Beersrist recommended that he come in for evaluation. He was very sad, soft spoken and depressed. He was oriented to unit and safety maintained with 15 min checks. Skin assessment was done by Clinical research associatewriter.

## 2018-04-16 NOTE — [facility] Group Notes (Signed)
Pt was invited, but did not attend goals and orientation group

## 2018-04-17 LAB — LIPID PANEL
CHOL/HDL RATIO: 4 ratio
Cholesterol: 174 mg/dL (ref 0–200)
HDL: 43 mg/dL (ref >40)
LDL CALC: 119 mg/dL — AB (ref 0–99)
Triglycerides: 58 mg/dL (ref <150)
VLDL: 12 mg/dL (ref 0–40)

## 2018-04-17 LAB — TSH: TSH: 1.442 u[IU]/mL (ref 0.350–4.500)

## 2018-04-17 NOTE — [facility] Counselor (Signed)
Adult Comprehensive Assessment  Patient ID: Nathaniel Park, male   DOB: 1955/10/21, 62 y.o.   MRN: [id number]  Information Source: Information source: Patient  Current Stressors:  Patient states their primary concerns and needs for treatment are:: Depression due loss of job, difficulty concentrating  Patient states their goals for this hospitilization and ongoing recovery are:: get motivated to get rid bad thoughts and things Educational / Learning stressors: not an issue Employment / Job issues: see above Family Relationships: not and Radio broadcast assistant / Lack of resources (include bankruptcy): yes low resources and funds Housing / Lack of housing: no Physical health (include injuries & life threatening diseases): good health Social relationships: no Substance abuse: no Bereavement / Loss: no  Living/Environment/Situation:  Living Arrangements: Spouse/significant other Living conditions (as described by patient or guardian): no issues in marriage 39 years Who else lives in the home?: no How long has patient lived in current situation?: 39 years What is atmosphere in current home: Comfortable  Family History:  Marital status: Married Number of Years Married: 11/05/37 What types of issues is patient dealing with in the relationship?: not Are you sexually active?: Yes What is your sexual orientation?: straight Has your sexual activity been affected by drugs, alcohol, medication, or emotional stress?: yes Does patient have children?: Yes How many children?: 1 How is patient's relationship with their children?: good  Childhood History:  By whom was/is the patient raised?: Mother, Other (Comment) Additional childhood history information: father died when patient was [age] Description of patient's relationship with caregiver when they were a child: good, mother was minister Patient's description of current relationship with people who raised him/her: mother died 05-Nov-2001 How were you disciplined  when you got in trouble as a child/adolescent?:  old school Does patient have siblings?: Yes Number of Siblings: 4 Description of patient's current relationship with siblings: its alright Did patient suffer any verbal/emotional/physical/sexual abuse as a child?: No Did patient suffer from severe childhood neglect?: No Has patient ever been sexually abused/assaulted/raped as an adolescent or adult?: No Was the patient ever a victim of a crime or a disaster?: No Witnessed domestic violence?: No Has patient been effected by domestic violence as an adult?: No  Education:  Highest grade of school patient has completed: 12th grade Currently a student?: No Learning disability?: No  Employment/Work Situation:   Employment situation: Unemployed Patient's job has been impacted by current illness: Yes Describe how patient's job has been impacted: difficulty concentrating What is the longest time patient has a held a job?: 40 years 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 Did You Receive Any Psychiatric Treatment/Services While in Equities trader?: No Are There Guns or Other Weapons in Your Home?: No(Patient remove gun from home) Are These Weapons Safely Secured?: Yes(Son took possession for his father)  Architect:   Financial resources: No income(Applying for disability and unemployment) Does patient have a Lawyer or guardian?: No  Alcohol/Substance Abuse:   What has been your use of drugs/alcohol within the last 12 months?: none If attempted suicide, did drugs/alcohol play a role in this?: No Alcohol/Substance Abuse Treatment Hx: Denies past history Has alcohol/substance abuse ever caused legal problems?: No  Social Support System:   Conservation officer, nature Support System: Good Describe Community Support System: wife and my son Type of faith/religion: Baptist How does patient's faith help to cope with current illness?: yes by using  prayers  Leisure/Recreation:   Leisure and Hobbies: Grilling  Strengths/Needs:   What is  the patient's perception of their strengths?: Good health and being able to function Patient states they can use these personal strengths during their treatment to contribute to their recovery: find a away to improve financial situation Patient states these barriers may affect/interfere with their treatment: no Patient states these barriers may affect their return to the community: no  Discharge Plan:   Currently receiving community mental health services: Yes (From Whom)([facility]) Patient states concerns and preferences for aftercare planning are: OPT, medications Patient states they will know when they are safe and ready for discharge when: Presently I am not having bad thoughts " take my life" Does patient have access to transportation?: Yes Does patient have financial barriers related to discharge medications?: No(hopefully, if my insurances is still active) Will patient be returning to same living situation after discharge?: Yes  Summary/Recommendations:   Summary and Recommendations (to be completed by the evaluator): 62 year old married male, presented to the ED voluntarily  at the recommendation of his outpatient psychiatrist. He reports worsening depression over the last several weeks. Attributes depression in part to unemployment. 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 He tried to get another job but states he was unable to do it. " I could not concentrate, I could not do it". Primary stressor is loss of income and readjusting to job market after the he worked for 40 years closed in October of this year.      Patient will benefit from crisis stabilization, medication evaluation, group therapy and psychoeducation, in addition to case management for discharge planning. At discharge it is recommended that Patient adhere to the established  discharge plan and continue in treatment. Anticipated outcomes: Mood will be stabilized, crisis will be stabilized, medications will be established if appropriate, coping skills will be taught and practiced, family session will be done to determine discharge plan, mental illness will be normalized, patient will be better equipped to recognize symptoms and ask for assistance.   Evorn Gongonnie D Loyde Orth. 04/17/2018

## 2018-04-17 NOTE — Progress Notes (Signed)
[facility] MD Progress Note  04/17/2018 2:25 PM Nathaniel Park  MRN:  [id number] Subjective: Patient reports she is feeling better today.  Describes improving mood and today does not endorse significant neurovegetative symptoms.  Currently denies suicidal ideations.  Denies medication side effects thus far. States he had a good visit with his wife/family members yesterday evening. Objective: I have reviewed chart notes and have met with patient. 62 year old married male, presents for depression, neuro-vegetative symptoms, suicidal ideations of shooting self , triggered by recent loss of a job he had for many years. He has been diagnosed with Schizophrenia in the past, and has history of remote psychiatric admissions ( 25 years ago) for psychotic symptoms, but currently denies hallucinations, does not present with thought disorder. He has been on Trilafon for years , denies side effects.  Patient currently reports improving mood and does present with a more reactive affect, smiles on approach. Today describes feeling better and at this time minimizes depression or significant neurovegetative symptoms of depression.  Denies suicidal ideations.  Presents future oriented, hoping to return home soon. Currently on Effexor XR (new medication for patient) denies side effects thus far.  He reports he has been on Trilafon for years and describes remote history of hallucinations and schizophrenia diagnosis.  At this time he is not presenting with active psychotic symptoms or appreciable negative symptoms. No disruptive or agitated behaviors on unit, pleasant on approach. TSH 1.44, Lipid panel WNL.  Principal Problem:  MDD Diagnosis:   Patient Active Problem List   Diagnosis Date Noted  . Schizophrenia ([place]) [F20.9] 04/15/2018   Total Time spent with patient: 15 minutes  Past Psychiatric History:   Past Medical History:  Past Medical History:  Diagnosis Date  . Depression    History reviewed. No pertinent  surgical history. Family History: History reviewed. No pertinent family history. Family Psychiatric  History:  Social History:  Social History   Substance and Sexual Activity  Alcohol Use Not Currently     Social History   Substance and Sexual Activity  Drug Use Not Currently    Social History   Socioeconomic History  . Marital status: Married    Spouse name: Not on file  . Number of children: Not on file  . Years of education: high school diploma  . Highest education level: 12th grade  Occupational History  . Occupation: currently unemployed  Social Needs  . Financial resource strain: Hard  . Food insecurity:    Worry: Often true    Inability: Sometimes true  . Transportation needs:    Medical: No    Non-medical: No  Tobacco Use  . Smoking status: Current Every Day Smoker    Packs/day: 1.00    Types: Cigarettes  . Smokeless tobacco: Never Used  Substance and Sexual Activity  . Alcohol use: Not Currently  . Drug use: Not Currently  . Sexual activity: Yes    Birth control/protection: None  Lifestyle  . Physical activity:    Days per week: 0 days    Minutes per session: 0 min  . Stress: Very much  Relationships  . Social connections:    Talks on phone: Not on file    Gets together: Not on file    Attends religious service: Not on file    Active member of club or organization: Not on file    Attends meetings of clubs or organizations: Not on file    Relationship status: Married  Other Topics Concern  . Not  on file  Social History Narrative  . Not on file   Additional Social History:   Sleep: Improving  Appetite:  Good  Current Medications: Current Facility-Administered Medications  Medication Dose Route Frequency Provider Last Rate Last Dose  . acetaminophen (TYLENOL) tablet 650 mg  650 mg Oral Q6H PRN Money, Lowry Ram, FNP      . alum & mag hydroxide-simeth (MAALOX/MYLANTA) 200-200-20 MG/5ML suspension 30 mL  30 mL Oral Q4H PRN Money, Lowry Ram, FNP       . LORazepam (ATIVAN) tablet 0.5 mg  0.5 mg Oral Q6H PRN Cobos, Myer Peer, MD      . magnesium hydroxide (MILK OF MAGNESIA) suspension 30 mL  30 mL Oral Daily PRN Money, Darnelle Maffucci B, FNP      . nicotine (NICODERM CQ - dosed in mg/24 hours) patch 21 mg  21 mg Transdermal Q0600 Laverle Hobby, PA-C   21 mg at 04/17/18 0834  . perphenazine (TRILAFON) tablet 16 mg  16 mg Oral QHS Money, Lowry Ram, FNP   16 mg at 04/16/18 2101  . traZODone (DESYREL) tablet 50 mg  50 mg Oral QHS PRN Money, Lowry Ram, FNP      . venlafaxine XR (EFFEXOR-XR) 24 hr capsule 37.5 mg  37.5 mg Oral Q breakfast Cobos, Myer Peer, MD   37.5 mg at 04/17/18 1610    Lab Results:  Results for orders placed or performed during the hospital encounter of 04/15/18 (from the past 48 hour(s))  TSH     Status: None   Collection Time: 04/17/18  6:18 AM  Result Value Ref Range   TSH 1.442 0.350 - 4.500 uIU/mL    Comment: Performed by a 3rd Generation assay with a functional sensitivity of <=0.01 uIU/mL. Performed at [facility], [place] [address], [place], [place] [address]   Lipid panel     Status: Abnormal   Collection Time: 04/17/18  6:18 AM  Result Value Ref Range   Cholesterol 174 0 - 200 mg/dL   Triglycerides 58 <150 mg/dL   HDL 43 >40 mg/dL   Total CHOL/HDL Ratio 4.0 RATIO   VLDL 12 0 - 40 mg/dL   LDL Cholesterol 119 (H) 0 - 99 mg/dL    Comment:        Total Cholesterol/HDL:CHD Risk Coronary Heart Disease Risk Table                     Men   Women  1/2 Average Risk   3.4   3.3  Average Risk       5.0   4.4  2 X Average Risk   9.6   7.1  3 X Average Risk  23.4   11.0        Use the calculated Patient Ratio above and the CHD Risk Table to determine the patient's CHD Risk.        ATP III CLASSIFICATION (LDL):  <100     mg/dL   Optimal  100-129  mg/dL   Near or Above                    Optimal  130-159  mg/dL   Borderline  160-189  mg/dL   High  >190     mg/dL   Very High Performed at [facility] [address], [place], [place] [address]     Blood Alcohol level:  Lab Results  Component Value Date   ETH <  10 16/83/7290    Metabolic Disorder Labs: No results found for: HGBA1C, MPG No results found for: PROLACTIN Lab Results  Component Value Date   CHOL 174 04/17/2018   TRIG 58 04/17/2018   HDL 43 04/17/2018   CHOLHDL 4.0 04/17/2018   VLDL 12 04/17/2018   LDLCALC 119 (H) 04/17/2018    Physical Findings: AIMS: Facial and Oral Movements Muscles of Facial Expression: None, normal Lips and Perioral Area: None, normal Jaw: None, normal Tongue: None, normal,Extremity Movements Upper (arms, wrists, hands, fingers): None, normal Lower (legs, knees, ankles, toes): None, normal, Trunk Movements Neck, shoulders, hips: None, normal, Overall Severity Severity of abnormal movements (highest score from questions above): None, normal Incapacitation due to abnormal movements: None, normal Patient's awareness of abnormal movements (rate only patient's report): No Awareness, Dental Status Current problems with teeth and/or dentures?: No Does patient usually wear dentures?: Yes(pt has dentures in upon admission)  CIWA:    COWS:     Musculoskeletal: Strength & Muscle Tone: within normal limits Gait & Station: normal Patient leans: N/A  Psychiatric Specialty Exam: Physical Exam  ROS currently denies headache, no chest pain, no shortness of breath, no vomiting  Blood pressure 97/66, pulse 75, temperature 98.2 F (36.8 C), temperature source Oral, resp. rate 18, height 6' (1.829 m), weight 74.8 kg.Body mass index is 22.38 kg/m.  General Appearance: Improved grooming  Eye Contact:  Good  Speech:  Normal Rate  Volume:  Normal  Mood:  Reports improving mood, minimizes depression today, states "I feel good"  Affect:  More reactive, smiles at times appropriately  Thought Process:  Linear and Descriptions of Associations: Intact  Orientation:  Other:  Alert  and attentive  Thought Content:  No hallucinations, no delusions expressed  Suicidal Thoughts:  No-currently no suicidal ideations, denies self-injurious ideations, contracts for safety on unit, denies homicidal ideations  Homicidal Thoughts:  No  Memory:  Recent and remote grossly intact  Judgement:  Fair/improving  Insight:  Fair/improving  Psychomotor Activity:  Normal  Concentration:  Concentration: Good and Attention Span: Good  Recall:  Good  Fund of Knowledge:  Good  Language:  Good  Akathisia:  Negative  Handed:  Right  AIMS (if indicated):   no movement disorders noted or reported  Assets:  Desire for Improvement Resilience  ADL's:  Intact  Cognition:  WNL  Sleep:  Number of Hours: 6.25   Assessment -  62 year old married male, presents for depression, neuro-vegetative symptoms, suicidal ideations of shooting self , triggered by recent loss of a job he had for many years. He has been diagnosed with Schizophrenia in the past, and has history of remote psychiatric admissions ( 25 years ago) for psychotic symptoms, but currently denies hallucinations, does not present with thought disorder. He has been on Trilafon for years , denies side effects.  Currently patient reports feeling better, less depressed, today minimizes depression or significant neurovegetative symptoms.  Denies any suicidal ideations, presents future oriented.  Thus far tolerating Effexor XR well.  Of note reports a remote diagnosis of schizophrenia and has been on Trilafon for many years.  Denies side effects.  At this time no active psychotic symptoms or negative symptoms are noted.  Treatment Plan Summary: Daily contact with patient to assess and evaluate symptoms and progress in treatment, Medication management, Plan Inpatient treatment and Medications as below Encourage group and milieu participation to work on coping skills and symptom reduction Continue Effexor XR 37.55mr daily for depression, anxiety For  now continue Trilafon 16 mgrs QDAY for history of psychosis-as noted patient reports he has been on this medication for years without side effects, and feels it has been effective in controlling said symptoms. Continue Trazodone 50 mg nightly PRN for insomnia Continue Ativan 0.5 mg every 6 hours PRN for anxiety Treatment team working on disposition planning options Jenne Campus, MD 04/17/2018, 2:25 PM

## 2018-04-17 NOTE — [facility] Group Notes (Signed)
[facility] LCSW Group Therapy Note  04/17/2018  10:00-11:00AM  Type of Therapy and Topic:  Group Therapy:  Adding Supports Including Being Your Own Support  Participation Level:  Active   Description of Group:  Patients in this group were introduced to the concept that additional supports including self-support are an essential part of recovery.  A song entitled "I Need Help!" was played and a group discussion was held in reaction to the idea of needing to add supports.  A song entitled "My Own Hero" was played and a group discussion ensued in which patients stated they could relate to the song and it inspired them to realize they have be willing to help themselves in order to succeed, because other people cannot achieve sobriety or stability for them.  We discussed adding a variety of healthy supports to address the various needs in their lives.  A song was played called "I Know Where I've Been" toward the end of group and used to conduct an inspirational wrap-up to group of remembering how far they have already come in their journey.  Therapeutic Goals: 1)  demonstrate the importance of being a part of one's own support system 2)  discuss reasons people in one's life may eventually be unable to be continually supportive  3)  identify the patient's current support system and   4)  elicit commitments to add healthy supports and to become more conscious of being self-supportive   Summary of Patient Progress:  The patient expressed the biggest support he needs right now is self-support which he believes would come in the form of increased self-confidence.  He also said he needs friends to keep him encouraged.   Therapeutic Modalities:   Motivational Interviewing Activity  Nathaniel Park

## 2018-04-17 NOTE — Plan of Care (Signed)
  Problem: Safety: Goal: Periods of time without injury will increase Outcome: Progressing Note:  Pt has not harmed self or others tonight.  He denies SI/HI and verbally contracts for safety.   

## 2018-04-17 NOTE — Progress Notes (Signed)
D: Patient is pleasant and states he feels better today.  He is compliant with his medications and denies any side effects.  He denies any depression or thoughts of self harm.  Patient denies any recent psychotic systems. Patient is observed interacting with his peers.  He rates his depression and hopelessness as a 2.  His goal today is to "get along with everyone and go to meetings."  His sleep and appetite are good; energy level is normal and his concentration is good.  A: Continue to monitor medication management and MD orders.  Safety checks completed every 15 minutes per protocol.  Offer support and encouragement as needed.  R: Patient is receptive to staff; his behavior is appropriate.

## 2018-04-17 NOTE — Progress Notes (Signed)
Adult Psychoeducational Group Note  Date:  04/17/2018 Time:  10:41 PM  Group Topic/Focus:  Wrap-Up Group:   The focus of this group is to help patients review their daily goal of treatment and discuss progress on daily workbooks.  Participation Level:  Active  Participation Quality:  Appropriate  Affect:  Appropriate  Cognitive:  Appropriate  Insight: Appropriate  Engagement in Group:  Engaged  Modes of Intervention:  Discussion  Additional Comments:  Patient attended group and said that his day was a 8. The most exciting part of his day was when he had a visit from his wife.   Parnika Tweten W Aizah Gehlhausen 04/17/2018, 10:41 PM

## 2018-04-17 NOTE — Progress Notes (Signed)
D: Pt was in dayroom upon initial approach.  Pt presents with appropriate affect and mood.  He reports his day was "great, I enjoyed everything.  I participated in group.  I watched a lot of football today.  I talked to the social worker because I need to get disability or get back in the workforce."  Pt reports he had a good visit with his family tonight.  Pt denies SI/HI, denies hallucinations, denies pain.  Pt has been visible in milieu interacting with peers and staff appropriately.  Pt attended evening group.    A: Introduced self to pt.  Met with pt 1:1.  Actively listened to pt and offered support and encouragement.  Medication administered per order.  Q15 minute safety checks maintained.  R: Pt is safe on the unit.  Pt is compliant with medication.  Pt verbally contracts for safety and reports he will notify staff of needs and concerns.  Will continue to monitor and assess.

## 2018-04-18 MED ORDER — VENLAFAXINE HCL ER 75 MG PO CP24
75.0000 mg | ORAL_CAPSULE | Freq: Every day | ORAL | Status: DC
  Administered 2018-04-19: 75 mg via ORAL
  Filled 2018-04-18 (×3): qty 1

## 2018-04-18 NOTE — Plan of Care (Signed)
  Problem: Education: Goal: Knowledge of [facility] Education information/materials will improve Outcome: Progressing Goal: Emotional status will improve Outcome: Progressing   Problem: Safety: Goal: Periods of time without injury will increase Outcome: Progressing   Patient oriented to the unit. Patient reports a good day and good mood. Patient remains safe and will continue to monitor.

## 2018-04-18 NOTE — Plan of Care (Signed)
Problem: Coping: Goal: Ability to verbalize frustrations and anger appropriately will improve Outcome: Progressing   Problem: Safety: Goal: Periods of time without injury will increase Outcome: Progressing   Problem: Medication: Goal: Compliance with prescribed medication regimen will improve Outcome: Progressing  DAR Note: Pt visible in dayroom on initial approach. A & O X4. Presents with depressed affect and mood. Denies SI, HI, AVH and pain when assessed. Reports he slept well last night with fair appetite, low energy and ok concentration level. Continues to ruminate loss of job "I was laid off after working for the company for 40 years, I'm young to draw social security, I need to get disability or else I don't know what to do". Rates his depression 6/10, hopelessness 7/10 and anxiety 7/10. Goal for today "to get better". Pt did not attend groups despite multiple prompts. Off unit for meals, tolerated all PO intake well. Compliant with medications when offered. Denies side effects / physical discomfort. Emotional support and encouragement provided to pt. All medications given per MD's orders with verbal education and effects monitored. Q 15 safety checks maintained without incident to note thus far.  Pt has been cooperative with care. Safety maintained on and off unit.

## 2018-04-18 NOTE — Progress Notes (Signed)
[facility] MD Progress Note  04/18/2018 11:59 AM Nathaniel Park  MRN:  [id number] Subjective: Patient states he is feeling significantly better than he did prior to admission.  As he improves he is more future oriented and hopeful for discharge soon. Currently denies medication side effects. Denies suicidal ideations. Objective: I have reviewed chart notes and have met with patient. 62 year old married male, presents for depression, neuro-vegetative symptoms, suicidal ideations of shooting self , triggered by recent loss of a job he had for many years. He has been diagnosed with Schizophrenia in the past, and has history of remote psychiatric admissions ( 25 years ago) for psychotic symptoms, but currently denies hallucinations, does not present with thought disorder. He has been on Trilafon for years , denies side effects.  Patient is presenting with improving mood and range of affect.  States he is feeling more reassured and less anxious about his stressors, more hopeful that he will be able to find and hold a job or applied for disability which he states he now understands is a long process.  She states he has had good visits from family and describes a good support network in his family. We have reviewed medication management history further.  Patient has been on Trilafon for many years, following a remote history of psychotic symptoms and diagnosis of schizophrenia at the time. He  has had no psychotic symptoms recently or at present, does not present with negative symptoms, and describes having achieved significant life milestones such as long-term marriage, working for many years. We discussed whether to initiate a gradual reduction of Trilafon dose but she states he has been on it for years and attributes his stability to being on this medication.  We have reviewed side effect profile to include risk of tardive dyskinesia.  He does have subtle/slight dyskinetic movements of tongue, but does not find these  distressing or concerning at this time. No disruptive or agitated behaviors on unit, pleasant on approach. Tolerating Effexor XR trial well thus far.  Principal Problem:  MDD Diagnosis:   Patient Active Problem List   Diagnosis Date Noted  . Schizophrenia ([place]) [F20.9] 04/15/2018   Total Time spent with patient: 20 minutes  Past Psychiatric History:   Past Medical History:  Past Medical History:  Diagnosis Date  . Depression    History reviewed. No pertinent surgical history. Family History: History reviewed. No pertinent family history. Family Psychiatric  History:  Social History:  Social History   Substance and Sexual Activity  Alcohol Use Not Currently     Social History   Substance and Sexual Activity  Drug Use Not Currently    Social History   Socioeconomic History  . Marital status: Married    Spouse name: Not on file  . Number of children: Not on file  . Years of education: high school diploma  . Highest education level: 12th grade  Occupational History  . Occupation: currently unemployed  Social Needs  . Financial resource strain: Hard  . Food insecurity:    Worry: Often true    Inability: Sometimes true  . Transportation needs:    Medical: No    Non-medical: No  Tobacco Use  . Smoking status: Current Every Day Smoker    Packs/day: 1.00    Types: Cigarettes  . Smokeless tobacco: Never Used  Substance and Sexual Activity  . Alcohol use: Not Currently  . Drug use: Not Currently  . Sexual activity: Yes    Birth control/protection: None  Lifestyle  . Physical activity:    Days per week: 0 days    Minutes per session: 0 min  . Stress: Very much  Relationships  . Social connections:    Talks on phone: Not on file    Gets together: Not on file    Attends religious service: Not on file    Active member of club or organization: Not on file    Attends meetings of clubs or organizations: Not on file    Relationship status: Married  Other Topics  Concern  . Not on file  Social History Narrative  . Not on file   Additional Social History:   Sleep: Good  Appetite:  Good  Current Medications: Current Facility-Administered Medications  Medication Dose Route Frequency Provider Last Rate Last Dose  . acetaminophen (TYLENOL) tablet 650 mg  650 mg Oral Q6H PRN Money, Lowry Ram, FNP      . alum & mag hydroxide-simeth (MAALOX/MYLANTA) 200-200-20 MG/5ML suspension 30 mL  30 mL Oral Q4H PRN Money, Lowry Ram, FNP      . LORazepam (ATIVAN) tablet 0.5 mg  0.5 mg Oral Q6H PRN Tabitha Tupper, Myer Peer, MD      . magnesium hydroxide (MILK OF MAGNESIA) suspension 30 mL  30 mL Oral Daily PRN Money, Darnelle Maffucci B, FNP      . nicotine (NICODERM CQ - dosed in mg/24 hours) patch 21 mg  21 mg Transdermal Q0600 Laverle Hobby, PA-C   21 mg at 04/18/18 0802  . perphenazine (TRILAFON) tablet 16 mg  16 mg Oral QHS Money, Lowry Ram, FNP   16 mg at 04/17/18 2105  . traZODone (DESYREL) tablet 50 mg  50 mg Oral QHS PRN Money, Lowry Ram, FNP      . [START ON 04/19/2018] venlafaxine XR (EFFEXOR-XR) 24 hr capsule 75 mg  75 mg Oral Q breakfast Jevin Camino, Myer Peer, MD        Lab Results:  Results for orders placed or performed during the hospital encounter of 04/15/18 (from the past 48 hour(s))  TSH     Status: None   Collection Time: 04/17/18  6:18 AM  Result Value Ref Range   TSH 1.442 0.350 - 4.500 uIU/mL    Comment: Performed by a 3rd Generation assay with a functional sensitivity of <=0.01 uIU/mL. Performed at [facility] [place] [address] [place],  [address]   Lipid panel     Status: Abnormal   Collection Time: 04/17/18  6:18 AM  Result Value Ref Range   Cholesterol 174 0 - 200 mg/dL   Triglycerides 58 <150 mg/dL   HDL 43 >40 mg/dL   Total CHOL/HDL Ratio 4.0 RATIO   VLDL 12 0 - 40 mg/dL   LDL Cholesterol 119 (H) 0 - 99 mg/dL    Comment:        Total Cholesterol/HDL:CHD Risk Coronary Heart Disease Risk Table                     Men    Women  1/2 Average Risk   3.4   3.3  Average Risk       5.0   4.4  2 X Average Risk   9.6   7.1  3 X Average Risk  23.4   11.0        Use the calculated Patient Ratio above and the CHD Risk Table to determine the patient's CHD Risk.        ATP III CLASSIFICATION (  LDL):  <100     mg/dL   Optimal  100-129  mg/dL   Near or Above                    Optimal  130-159  mg/dL   Borderline  160-189  mg/dL   High  >190     mg/dL   Very High Performed at [facility] [address], [place], [place] [address]     Blood Alcohol level:  Lab Results  Component Value Date   ETH <10 14/48/1856    Metabolic Disorder Labs: No results found for: HGBA1C, MPG No results found for: PROLACTIN Lab Results  Component Value Date   CHOL 174 04/17/2018   TRIG 58 04/17/2018   HDL 43 04/17/2018   CHOLHDL 4.0 04/17/2018   VLDL 12 04/17/2018   LDLCALC 119 (H) 04/17/2018    Physical Findings: AIMS: Facial and Oral Movements Muscles of Facial Expression: None, normal Lips and Perioral Area: None, normal Jaw: None, normal Tongue: None, normal,Extremity Movements Upper (arms, wrists, hands, fingers): None, normal Lower (legs, knees, ankles, toes): None, normal, Trunk Movements Neck, shoulders, hips: None, normal, Overall Severity Severity of abnormal movements (highest score from questions above): None, normal Incapacitation due to abnormal movements: None, normal Patient's awareness of abnormal movements (rate only patient's report): No Awareness, Dental Status Current problems with teeth and/or dentures?: No Does patient usually wear dentures?: Yes(pt has dentures in upon admission)  CIWA:    COWS:     Musculoskeletal: Strength & Muscle Tone: within normal limits Gait & Station: normal Patient leans: N/A  Psychiatric Specialty Exam: Physical Exam  ROS currently denies headache, no chest pain, no shortness of breath, no vomiting  Blood pressure 116/87, pulse 92,  temperature 98.7 F (37.1 C), temperature source Oral, resp. rate 18, height 6' (1.829 m), weight 74.8 kg.Body mass index is 22.38 kg/m.  General Appearance: Well Groomed  Eye Contact:  Good  Speech:  Normal Rate  Volume:  Normal  Mood:  Improving mood, currently minimizes depression, states he feels a lot better  Affect:  Reactive, fuller in range  Thought Process:  Linear and Descriptions of Associations: Intact  Orientation:  Other:  Alert and attentive  Thought Content:  No hallucinations, no delusions expressed  Suicidal Thoughts:  No-currently no suicidal ideations, denies self-injurious ideations, contracts for safety on unit, denies homicidal ideations  Homicidal Thoughts:  No  Memory:  Recent and remote grossly intact  Judgement:  improving  Insight:  Fair/improving  Psychomotor Activity:  Normal  Concentration:  Concentration: Good and Attention Span: Good  Recall:  Good  Fund of Knowledge:  Good  Language:  Good  Akathisia:  Negative  Handed:  Right  AIMS (if indicated):    subtle dyskinetic movements of tongue noted .   Assets:  Desire for Improvement Resilience  ADL's:  Intact  Cognition:  WNL  Sleep:  Number of Hours: 6.5   Assessment -  62 year old married male, presents for depression, neuro-vegetative symptoms, suicidal ideations of shooting self , triggered by recent loss of a job he had for many years. He has been diagnosed with Schizophrenia in the past, and has history of remote psychiatric admissions ( 25 years ago) for psychotic symptoms, but currently denies hallucinations, does not present with thought disorder. He has been on Trilafon for years , denies side effects.  Patient is presenting with improving mood and fuller range of affect.  Currently minimizes neurovegetative  symptoms and states he feels more hopeful.  No suicidal ideations at this time.  Thus far tolerating Effexor XR well.  On Trilafon for many years, no current symptoms of  psychosis.  Treatment Plan Summary: Daily contact with patient to assess and evaluate symptoms and progress in treatment, Medication management, Plan Inpatient treatment and Medications as below  Treatment Plan reviewed as below today 11/18.  Encourage group and milieu participation to work on coping skills and symptom reduction Increase Effexor XR to 75 mgr daily for depression, anxiety For now continue Trilafon 16 mgrs QDAY for history of psychosis-as noted patient reports he has been on this medication for years without side effects, and feels it has been effective in controlling said symptoms. Continue Trazodone 50 mg nightly PRN for insomnia Continue Ativan 0.5 mg every 6 hours PRN for anxiety Treatment team working on disposition planning options Jenne Campus, MD 04/18/2018, 11:59 AM   Patient ID: Nathaniel Park, male   DOB: [birth year], 62 y.o.   MRN: [id number]

## 2018-04-18 NOTE — Progress Notes (Signed)
D: Patient is alert, pleasant, and cooperative. Patient denies SI, HI, AVH, and verbally contracts for safety. Patient reports he has been sleeping well. Patient reports a good day rated 8/10. Patient enjoyed going to the gym today. Patient reports anxiety about going home. Patient denies physical symptoms/pain.    A: Scheduled medications administered per MD order. Support provided. Patient educated on safety on the unit and medications. Routine safety checks every 15 minutes. Patient stated understanding to tell nurse about any new physical symptoms. Patient understands to tell staff of any needs.     R: No adverse drug reactions noted. Patient verbally contracts for safety. Patient remains safe at this time and will continue to monitor.

## 2018-04-18 NOTE — Tx Team (Signed)
Interdisciplinary Treatment and Diagnostic Plan Update  04/18/2018 Time of Session:  Nathaniel Park MRN: [id number]  Principal Diagnosis: <principal problem not specified>  Secondary Diagnoses: Active Problems:   Schizophrenia ([place])   Current Medications:  Current Facility-Administered Medications  Medication Dose Route Frequency Provider Last Rate Last Dose  . acetaminophen (TYLENOL) tablet 650 mg  650 mg Oral Q6H PRN Money, Lowry Ram, FNP      . alum & mag hydroxide-simeth (MAALOX/MYLANTA) 200-200-20 MG/5ML suspension 30 mL  30 mL Oral Q4H PRN Money, Lowry Ram, FNP      . LORazepam (ATIVAN) tablet 0.5 mg  0.5 mg Oral Q6H PRN Cobos, Myer Peer, MD      . magnesium hydroxide (MILK OF MAGNESIA) suspension 30 mL  30 mL Oral Daily PRN Money, Darnelle Maffucci B, FNP      . nicotine (NICODERM CQ - dosed in mg/24 hours) patch 21 mg  21 mg Transdermal Q0600 Laverle Hobby, PA-C   21 mg at 04/18/18 0802  . perphenazine (TRILAFON) tablet 16 mg  16 mg Oral QHS Money, Lowry Ram, FNP   16 mg at 04/17/18 2105  . traZODone (DESYREL) tablet 50 mg  50 mg Oral QHS PRN Money, Lowry Ram, FNP      . [START ON 04/19/2018] venlafaxine XR (EFFEXOR-XR) 24 hr capsule 75 mg  75 mg Oral Q breakfast Cobos, Myer Peer, MD       PTA Medications: Medications Prior to Admission  Medication Sig Dispense Refill Last Dose  . clobetasol ointment (TEMOVATE) 0.10 % Apply 1 application topically daily.   6 04/14/2018 at Unknown time  . perphenazine (TRILAFON) 16 MG tablet Take 16 mg by mouth at bedtime.  6 04/14/2018 at Unknown time    Patient Stressors: Financial difficulties Loss of job recently  Patient Strengths: Ability for insight Average or above average intelligence Capable of independent living Motivation for treatment/growth Supportive family/friends  Treatment Modalities: Medication Management, Group therapy, Case management,  1 to 1 session with clinician, Psychoeducation, Recreational therapy.   Physician  Treatment Plan for Primary Diagnosis: <principal problem not specified> Long Term Goal(s): Improvement in symptoms so as ready for discharge Improvement in symptoms so as ready for discharge   Short Term Goals: Ability to identify changes in lifestyle to reduce recurrence of condition will improve Ability to maintain clinical measurements within normal limits will improve Ability to identify changes in lifestyle to reduce recurrence of condition will improve Ability to maintain clinical measurements within normal limits will improve  Medication Management: Evaluate patient's response, side effects, and tolerance of medication regimen.  Therapeutic Interventions: 1 to 1 sessions, Unit Group sessions and Medication administration.  Evaluation of Outcomes: Not Met  Physician Treatment Plan for Secondary Diagnosis: Active Problems:   Schizophrenia ([place])  Long Term Goal(s): Improvement in symptoms so as ready for discharge Improvement in symptoms so as ready for discharge   Short Term Goals: Ability to identify changes in lifestyle to reduce recurrence of condition will improve Ability to maintain clinical measurements within normal limits will improve Ability to identify changes in lifestyle to reduce recurrence of condition will improve Ability to maintain clinical measurements within normal limits will improve     Medication Management: Evaluate patient's response, side effects, and tolerance of medication regimen.  Therapeutic Interventions: 1 to 1 sessions, Unit Group sessions and Medication administration.  Evaluation of Outcomes: Not Met   RN Treatment Plan for Primary Diagnosis: <principal problem not specified> Long Term Goal(s): Knowledge of disease and therapeutic  regimen to maintain health will improve  Short Term Goals: Ability to participate in decision making will improve, Ability to verbalize feelings will improve, Ability to disclose and discuss suicidal ideas, Ability  to identify and develop effective coping behaviors will improve and Compliance with prescribed medications will improve  Medication Management: RN will administer medications as ordered by provider, will assess and evaluate patient's response and provide education to patient for prescribed medication. RN will report any adverse and/or side effects to prescribing provider.  Therapeutic Interventions: 1 on 1 counseling sessions, Psychoeducation, Medication administration, Evaluate responses to treatment, Monitor vital signs and CBGs as ordered, Perform/monitor CIWA, COWS, AIMS and Fall Risk screenings as ordered, Perform wound care treatments as ordered.  Evaluation of Outcomes: Not Met   LCSW Treatment Plan for Primary Diagnosis: <principal problem not specified> Long Term Goal(s): Safe transition to appropriate next level of care at discharge, Engage patient in therapeutic group addressing interpersonal concerns.  Short Term Goals: Engage patient in aftercare planning with referrals and resources  Therapeutic Interventions: Assess for all discharge needs, 1 to 1 time with Social worker, Explore available resources and support systems, Assess for adequacy in community support network, Educate family and significant other(s) on suicide prevention, Complete Psychosocial Assessment, Interpersonal group therapy.  Evaluation of Outcomes: Not Met   Progress in Treatment: Attending groups: Yes. Participating in groups: Yes. Taking medication as prescribed: Yes. Toleration medication: Yes. Family/Significant other contact made: No, will contact:  the patient's wife Patient understands diagnosis: Yes. Discussing patient identified problems/goals with staff: Yes. Medical problems stabilized or resolved: Yes. Denies suicidal/homicidal ideation: Yes. Issues/concerns per patient self-inventory: No. Other:   New problem(s) identified: None   New Short Term/Long Term Goal(s): medication  stabilization, elimination of SI thoughts, development of comprehensive mental wellness plan.    Patient Goals:  "focus on getting a job and getting disability"  Discharge Plan or Barriers: CSW will assess for appropriate referrals and possible discharge planning.   Reason for Continuation of Hospitalization: Anxiety Depression Medication stabilization Suicidal ideation  Estimated Length of Stay: 2-3 days   Attendees: Patient: Nathaniel Park  04/18/2018 3:34 PM  Physician: Dr. Neita Garnet, MD 04/18/2018 3:34 PM  Nursing: Nicoletta Dress.Viona Gilmore, RN 04/18/2018 3:34 PM  RN Care Manager: Lars Pinks, RN 04/18/2018 3:34 PM  Social Worker: Radonna Ricker, Eleanor 04/18/2018 3:34 PM  Recreational Therapist:  04/18/2018 3:34 PM  Other:  04/18/2018 3:34 PM  Other:  04/18/2018 3:34 PM  Other: 04/18/2018 3:34 PM    Scribe for Treatment Team: Marylee Floras, [place] 04/18/2018 3:34 PM

## 2018-04-18 NOTE — Progress Notes (Signed)
Recreation Therapy Notes  Date: 11.18.19 Time: 0930 Location: [address] Dayroom  Group Topic: Stress Management  Goal Area(s) Addresses:  Patient will verbalize importance of using healthy stress management.  Patient will identify positive emotions associated with healthy stress management.   Intervention: Stress Management  Activity :  Guided Imagery.  LRT introduced the stress management technique of guided imagery.  LRT read a script that took patients on a journey through the forest.  Patients were to listen as the script was read to engage in the activity.  Education:  Stress Management, Discharge Planning.   Education Outcome: Acknowledges edcuation/In group clarification offered/Needs additional education  Clinical Observations/Feedback: Pt did not attend group.     Nathaniel Park,  LRT/CTRS         Nathaniel Park A 04/18/2018 11:30 AM 

## 2018-04-19 DIAGNOSIS — F209 Schizophrenia, unspecified: Secondary | ICD-10-CM

## 2018-04-19 LAB — HEMOGLOBIN A1C
HEMOGLOBIN A1C: 5.7 % — AB (ref 4.8–5.6)
Mean Plasma Glucose: 117 mg/dL

## 2018-04-19 MED ORDER — PERPHENAZINE 16 MG PO TABS: 16.0000 mg | ORAL_TABLET | Freq: Every day | ORAL | 0 refills | Status: AC

## 2018-04-19 MED ORDER — VENLAFAXINE HCL ER 75 MG PO CP24: 75.0000 mg | ORAL_CAPSULE | Freq: Every day | ORAL | 0 refills | Status: DC

## 2018-04-19 MED ORDER — TRAZODONE HCL 50 MG PO TABS: 50.0000 mg | ORAL_TABLET | Freq: Every evening | ORAL | 0 refills | Status: DC | PRN

## 2018-04-19 MED ORDER — NICOTINE 21 MG/24HR TD PT24: 21.0000 mg | MEDICATED_PATCH | Freq: Every day | TRANSDERMAL | 0 refills | Status: DC

## 2018-04-19 NOTE — [facility] Suicide Risk Assessment (Signed)
[facility] INPATIENT:  Family/Significant Other Suicide Prevention Education  Suicide Prevention Education:  Education Completed; Nathaniel Park, wife [phone]) has been identified by the patient as the family member/significant other with whom the patient will be residing, and identified as the person(s) who will aid the patient in the event of a mental health crisis (suicidal ideations/suicide attempt).  With written consent from the patient, the family member/significant other has been provided the following suicide prevention education, prior to the and/or following the discharge of the patient.  The suicide prevention education provided includes the following:  Suicide risk factors  Suicide prevention and interventions  National Suicide Hotline telephone number  [facility] assessment telephone number  [facility] Emergency Assistance 911  [facility] and/or [facility]idential Mobile Crisis Unit telephone number  Request made of family/significant other to:  Remove weapons (e.g., guns, rifles, knives), all items previously/currently identified as safety concern.    Remove drugs/medications (over-the-counter, prescriptions, illicit drugs), all items previously/currently identified as a safety concern.  The family member/significant other verbalizes understanding of the suicide prevention education information provided.  The family member/significant other agrees to remove the items of safety concern listed above.  Patient's wife reports that she does not have any questions or concerns regarding the patient discharging today.    Nathaniel Park 04/19/2018, 10:26 AM

## 2018-04-19 NOTE — [facility] Suicide Risk Assessment (Signed)
[facility]   Principal Problem: <principal problem not specified> Discharge Diagnoses: Active Problems:   Schizophrenia ([facility])   Total Time spent with patient: 20 minutes  Musculoskeletal: Strength & Muscle Tone: within normal limits Gait & Station: normal Patient leans: N/A  Psychiatric Specialty Exam: Review of Systems  All other systems reviewed and are negative.   Blood pressure 104/68, pulse 93, temperature 97.6 F (36.4 C), resp. rate 18, height 6' (1.829 m), weight 74.8 kg.Body mass index is 22.38 kg/m.  General Appearance: Casual  Eye Contact::  Good  Speech:  Normal Rate409  Volume:  Normal  Mood:  Euthymic  Affect:  Congruent  Thought Process:  Coherent and Descriptions of Associations: Intact  Orientation:  Full (Time, Place, and Person)  Thought Content:  Logical  Suicidal Thoughts:  No  Homicidal Thoughts:  No  Memory:  Immediate;   Fair Recent;   Fair Remote;   Fair  Judgement:  Intact  Insight:  Fair  Psychomotor Activity:  Normal  Concentration:  Good  Recall:  Good  Fund of Knowledge:Good  Language: Good  Akathisia:  Negative  Handed:  Right  AIMS (if indicated):     Assets:  Communication Skills Desire for Improvement Financial Resources/Insurance Housing Leisure Time Physical Health Resilience Social Support  Sleep:  Number of Hours: 6.75  Cognition: WNL  ADL's:  Intact   Mental Status Per Nursing Assessment::   On Admission:  Suicidal ideation indicated by patient  Demographic Factors:  Male  Loss Factors: NA  Historical Factors: Impulsivity  Risk Reduction Factors:   Sense of responsibility to family, Living with another person, especially a relative and Positive social support  Continued Clinical Symptoms:  Depression:   Impulsivity  Cognitive Features That Contribute To Risk:  None    Suicide Risk:  Minimal: No identifiable suicidal ideation.  Patients presenting with no risk factors but  with morbid ruminations; may be classified as minimal risk based on the severity of the depressive symptoms  Follow-up Information    [facility]. Go on 04/18/2018.   Specialty:  Behavioral Health Why:  Please attend your hospital follow up appointment on.... Contact information: [address] [place] [address] [phone]           Plan Of Care/Follow-up recommendations:  Activity:  ad lib  Antonieta PertGreg Lawson Clary, MD 04/19/2018, 8:08 AM

## 2018-04-19 NOTE — Progress Notes (Signed)
Pt received both written and verbal discharge instructions. Pt verbalized understanding of discharge instructions. Pt agreed to f/u appt and med regimen. Pt received SRA, AVS, suicide prevention sheet, transitional record and prescriptions. Pt gathered belongings from room and locker. Pt safely discharged to the lobby.

## 2018-04-19 NOTE — Progress Notes (Signed)
Patient attended group and participated

## 2018-04-19 NOTE — Discharge Summary (Signed)
Physician Discharge Summary Note  Patient:  Nathaniel Park is an 62 y.o., male MRN:  [id number] DOB:  1956/05/03 Patient phone:  [phone] (home)  Patient address:   [place] [address],  Total Time spent with patient: Greater than 30 minutes  Date of Admission:  04/15/2018  Date of Discharge: 04-19-18  Reason for Admission: Worsening symptoms of depression.  Principal Problem: Schizophrenia [facility])  Discharge Diagnoses: Principal Problem:   Schizophrenia [facility])  Past Psychiatric History: Schizophrenia  Past Medical History:  Past Medical History:  Diagnosis Date  . Depression    History reviewed. No pertinent surgical history.  Family History: History reviewed. No pertinent family history.  Family Psychiatric  History: See H&P  Social History:  Social History   Substance and Sexual Activity  Alcohol Use Not Currently     Social History   Substance and Sexual Activity  Drug Use Not Currently    Social History   Socioeconomic History  . Marital status: Married    Spouse name: Not on file  . Number of children: Not on file  . Years of education: high school diploma  . Highest education level: 12th grade  Occupational History  . Occupation: currently unemployed  Social Needs  . Financial resource strain: Hard  . Food insecurity:    Worry: Often true    Inability: Sometimes true  . Transportation needs:    Medical: No    Non-medical: No  Tobacco Use  . Smoking status: Current Every Day Smoker    Packs/day: 1.00    Types: Cigarettes  . Smokeless tobacco: Never Used  Substance and Sexual Activity  . Alcohol use: Not Currently  . Drug use: Not Currently  . Sexual activity: Yes    Birth control/protection: None  Lifestyle  . Physical activity:    Days per week: 0 days    Minutes per session: 0 min  . Stress: Very much  Relationships  . Social connections:    Talks on phone: Not on file    Gets together: Not on file    Attends  religious service: Not on file    Active member of club or organization: Not on file    Attends meetings of clubs or organizations: Not on file    Relationship status: Married  Other Topics Concern  . Not on file  Social History Narrative  . Not on file   Hospital Course: (Per Md's admission evaluation): 62 year old married male, presented to the ED voluntarily  at the recommendation of his outpatient psychiatrist. He reports worsening depression over the last several weeks. Attributes depression in part to unemployment. States he had a job for many years until the [employer] he worked at closed down recently. He tried to get another job but states he was unable to do it. " I could not concentrate, I could not do it". Reports neuro-vegetative symptoms of depression, with thoughts of shooting self. Endorses neuro-vegetative symptoms as below. At this time patient denies hallucinations, and does not appear internally preoccupied, but did endorse auditory hallucinations during ED evaluation.   Upon his admission to the [facility], Mr. Jallow was evaluated & his presenting symptoms noted. The medication regimen for his presenting symptoms were discussed & with his consent initiated. He was medicated & discharged on; Perphenazine 16 mg for mood control, Effexor XR 75 mg for depression & Trazodone 50 mg prn for insomnia. He was also enrolled in the group counseling sessions being held on this unit  to help him learn coping skills that will aid him in maintaining mood stability. He attended and participated in these activities as recommended.  Besides the mood stabilization treatments, Yusuke received no other medication regimen as he presented no other medical issues. He tolerated his treatment regimen without any significant adverse effects and or reactions reported. Patient's symptoms did respond adequately to his treatment plan. This is evidenced by his reports of improved mood, presentation of good affect and reports  of symptom reduction.  The treatment team met this morning during treatment team meeting. Patient's treatment plan, reasons for admission and response to treatment regimen discussed. It was agreed upon that he is doing well, stable & ready to be discharged to continue further psychiatric care on an outpatient basis as noted below. He was provided with all the necessary information needed to make this appointment without problems. He left [facility] with all personal belongings in no apparent distress.  Physical Findings: AIMS: Facial and Oral Movements Muscles of Facial Expression: None, normal Lips and Perioral Area: None, normal Jaw: None, normal Tongue: None, normal,Extremity Movements Upper (arms, wrists, hands, fingers): None, normal Lower (legs, knees, ankles, toes): None, normal, Trunk Movements Neck, shoulders, hips: None, normal, Overall Severity Severity of abnormal movements (highest score from questions above): None, normal Incapacitation due to abnormal movements: None, normal Patient's awareness of abnormal movements (rate only patient's report): No Awareness, Dental Status Current problems with teeth and/or dentures?: Yes(no teeth) Does patient usually wear dentures?: Yes  CIWA:    COWS:     Musculoskeletal: Strength & Muscle Tone: within normal limits Gait & Station: normal Patient leans: N/A  Psychiatric Specialty Exam: Physical Exam  Nursing note and vitals reviewed. Constitutional: He appears well-developed.  HENT:  Head: Normocephalic.  Eyes: Pupils are equal, round, and reactive to light.  Neck: Normal range of motion.  Cardiovascular: Normal rate.  Respiratory: Effort normal.  GI: Soft.  Genitourinary:  Genitourinary Comments: Deferred  Musculoskeletal: Normal range of motion.  Neurological: He is alert.  Skin: Skin is warm.    Review of Systems  Constitutional: Negative.   HENT: Negative.   Eyes: Negative.   Respiratory: Negative.  Negative for cough  and shortness of breath.   Cardiovascular: Negative.  Negative for chest pain and palpitations.  Gastrointestinal: Negative.  Negative for abdominal pain, heartburn, nausea and vomiting.  Genitourinary: Negative.   Musculoskeletal: Negative.   Skin: Negative.   Neurological: Negative.  Negative for dizziness.  Endo/Heme/Allergies: Negative.   Psychiatric/Behavioral: Positive for depression (Stable). Negative for hallucinations, memory loss, substance abuse and suicidal ideas. The patient has insomnia (Stable). The patient is not nervous/anxious (Stable).     Blood pressure 104/68, pulse 93, temperature 97.6 F (36.4 C), resp. rate 18, height 6' (1.829 m), weight 74.8 kg.Body mass index is 22.38 kg/m.  See Md's discharge SRA   Have you used any form of tobacco in the last 30 days? (Cigarettes, Smokeless Tobacco, Cigars, and/or Pipes): Yes  Has this patient used any form of tobacco in the last 30 days? (Cigarettes, Smokeless Tobacco, Cigars, and/or Pipes): Yes, an FDA-approved tobacco cessation medication was offered at discharge.   Blood Alcohol level:  Lab Results  Component Value Date   ETH <10 35/32/9924    Metabolic Disorder Labs:  Lab Results  Component Value Date   HGBA1C 5.7 (H) 04/17/2018   MPG 117 04/17/2018   No results found for: PROLACTIN Lab Results  Component Value Date   CHOL 174 04/17/2018   TRIG  58 04/17/2018   HDL 43 04/17/2018   CHOLHDL 4.0 04/17/2018   VLDL 12 04/17/2018   LDLCALC 119 (H) 04/17/2018   See Psychiatric Specialty Exam and Suicide Risk Assessment completed by Attending Physician prior to discharge.  Discharge destination:  Home  Is patient on multiple antipsychotic therapies at discharge:  No   Has Patient had three or more failed trials of antipsychotic monotherapy by history:  No  Recommended Plan for Multiple Antipsychotic Therapies: NA  Allergies as of 04/19/2018   No Known Allergies     Medication List    STOP taking these  medications   clobetasol ointment 0.05 % Commonly known as:  TEMOVATE     TAKE these medications     Indication  nicotine 21 mg/24hr patch Commonly known as:  NICODERM CQ - dosed in mg/24 hours Place 1 patch (21 mg total) onto the skin daily at 6 (six) AM. (May buy from over the counter): For smoking cessation Start taking on:  04/20/2018  Indication:  Nicotine Addiction   perphenazine 16 MG tablet Commonly known as:  TRILAFON Take 1 tablet (16 mg total) by mouth at bedtime. For mood control What changed:  additional instructions  Indication:  Mood control   traZODone 50 MG tablet Commonly known as:  DESYREL Take 1 tablet (50 mg total) by mouth at bedtime as needed for sleep.  Indication:  Trouble Sleeping   venlafaxine XR 75 MG 24 hr capsule Commonly known as:  EFFEXOR-XR Take 1 capsule (75 mg total) by mouth daily with breakfast. For depression Start taking on:  04/20/2018  Indication:  Major Depressive Disorder      Follow-up [place]. Go on 04/26/2018.   Specialty:  Behavioral Health Why:  Please attend your hospital follow up appointment on Tuesday,04/26/18 at 12:15pm. Please be sure to bring any discharge paperwork from this hospitalization, including your updated list of medicatios.  Contact information: [place] [address] [phone]          Follow-up recommendations: Activity:  As tolerated Diet: As recommended by your primary care doctor. Keep all scheduled follow-up appointments as recommended.   Comments: Patient is instructed prior to discharge to: Take all medications as prescribed by his/her mental healthcare provider. Report any adverse effects and or reactions from the medicines to his/her outpatient provider promptly. Patient has been instructed & cautioned: To not engage in alcohol and or illegal drug use while on prescription medicines. In the event of worsening  symptoms, patient is instructed to call the crisis hotline, 911 and or go to the nearest ED for appropriate evaluation and treatment of symptoms. To follow-up with his/her primary care provider for your other medical issues, concerns and or health care needs.   Signed: Lindell Spar, NP, PMHNP, FNP-BC 04/19/2018, 10:44 AM

## 2018-04-19 NOTE — Progress Notes (Signed)
  [facility] Discharge Plan :  Will you be returning to the same living situation after discharge:  Yes,  patient reports he is returning home with his wife At discharge, do you have transportation home?: Yes,  patient wife is picking the patient up at discharge  Do you have the ability to pay for your medications: Yes,  BCBS  Release of information consent forms completed and in the chart;  Patient's signature needed at discharge.  Patient to Follow up at: Follow-up Information    [facility]. Go on 04/26/2018.   Specialty:  Behavioral Health Why:  Please attend your hospital follow up appointment on Tuesday,04/26/18 at 12:15pm. Please be sure to bring any discharge paperwork from this hospitalization, including your updated list of medicatios.  Contact information: [address] [place] [address] [phone]           Next level of care provider [facility] Link:yes  Safety Planning and Suicide Prevention discussed: Yes,  with the patient's wife  Have you used any form of tobacco in the last 30 days? (Cigarettes, Smokeless Tobacco, Cigars, and/or Pipes): Yes  Has patient been referred to the Quitline?: Patient refused referral  Patient has been referred for addiction treatment: N/A  Maeola SarahJolan E Kassidy Frankson, LCSWA 04/19/2018, 10:52 AM

## 2019-07-27 ENCOUNTER — Ambulatory Visit: Payer: Self-pay | Attending: Family

## 2019-07-27 DIAGNOSIS — Z23 Encounter for immunization: Secondary | ICD-10-CM | POA: Insufficient documentation

## 2019-07-27 NOTE — Progress Notes (Signed)
   Covid-19 Vaccination Clinic  Name:  Alfredo Spong    MRN: [id number] DOB: 06/12/1955  07/27/2019  Mr. Polivka was observed post Covid-19 immunization for 15 minutes without incidence. He was provided with Vaccine Information Sheet and instruction to access the V-Safe system.   Mr. Picklesimer was instructed to call 911 with any severe reactions post vaccine: Marland Kitchen Difficulty breathing  . Swelling of your face and throat  . A fast heartbeat  . A bad rash all over your body  . Dizziness and weakness    Immunizations Administered    Name Date Dose VIS Date Route   Moderna COVID-19 Vaccine 07/27/2019  2:08 PM 0.5 mL 05/02/2019 Intramuscular   Manufacturer: Moderna   Lot: [id number]   NDC: 16837-290-21

## 2019-08-29 ENCOUNTER — Ambulatory Visit: Payer: Self-pay | Attending: Family

## 2019-08-29 DIAGNOSIS — Z23 Encounter for immunization: Secondary | ICD-10-CM

## 2019-08-29 NOTE — Progress Notes (Signed)
   Covid-19 Vaccination Clinic  Name:  Nathaniel Park    MRN: [id number] DOB: 03-08-1956  08/29/2019  [name] was observed post Covid-19 immunization for 15 minutes without incident. He was provided with Vaccine Information Sheet and instruction to access the V-Safe system.   [name] was instructed to call 911 with any severe reactions post vaccine: Marland Kitchen Difficulty breathing  . Swelling of face and throat  . A fast heartbeat  . A bad rash all over body  . Dizziness and weakness   Immunizations Administered    Name Date Dose VIS Date Route   Moderna COVID-19 Vaccine 08/29/2019  2:07 PM 0.5 mL 05/02/2019 Intramuscular   Manufacturer: Moderna   Lot: [id number]   NDC: 88280-034-91

## 2020-05-08 ENCOUNTER — Emergency Department (HOSPITAL_COMMUNITY): Payer: PRIVATE HEALTH INSURANCE

## 2020-05-08 ENCOUNTER — Other Ambulatory Visit: Payer: Self-pay

## 2020-05-08 ENCOUNTER — Emergency Department (HOSPITAL_COMMUNITY)
Admission: EM | Admit: 2020-05-08 | Discharge: 2020-05-08 | Disposition: A | Discharge status: Home / Self Care | Payer: PRIVATE HEALTH INSURANCE | Attending: Emergency Medicine | Admitting: Emergency Medicine

## 2020-05-08 ENCOUNTER — Encounter (HOSPITAL_COMMUNITY): Payer: Self-pay | Admitting: Emergency Medicine

## 2020-05-08 DIAGNOSIS — W278XXA Contact with other nonpowered hand tool, initial encounter: Secondary | ICD-10-CM | POA: Diagnosis not present

## 2020-05-08 DIAGNOSIS — S62621B Displaced fracture of medial phalanx of left index finger, initial encounter for open fracture: Secondary | ICD-10-CM | POA: Insufficient documentation

## 2020-05-08 DIAGNOSIS — Y9269 Other specified industrial and construction area as the place of occurrence of the external cause: Secondary | ICD-10-CM | POA: Insufficient documentation

## 2020-05-08 DIAGNOSIS — F17210 Nicotine dependence, cigarettes, uncomplicated: Secondary | ICD-10-CM | POA: Diagnosis not present

## 2020-05-08 DIAGNOSIS — Z23 Encounter for immunization: Secondary | ICD-10-CM | POA: Diagnosis not present

## 2020-05-08 DIAGNOSIS — S60941A Unspecified superficial injury of left index finger, initial encounter: Secondary | ICD-10-CM | POA: Diagnosis present

## 2020-05-08 LAB — CBC WITH DIFFERENTIAL/PLATELET
Abs Immature Granulocytes: 0.02 10*3/uL (ref 0.00–0.07)
Basophils Absolute: 0 10*3/uL (ref 0.0–0.1)
Basophils Relative: 0 %
Eosinophils Absolute: 0.1 10*3/uL (ref 0.0–0.5)
Eosinophils Relative: 1 %
HCT: 42.8 % (ref 39.0–52.0)
Hemoglobin: 14.3 g/dL (ref 13.0–17.0)
Immature Granulocytes: 0 %
Lymphocytes Relative: 13 %
Lymphs Abs: 1.2 10*3/uL (ref 0.7–4.0)
MCH: 32.6 pg (ref 26.0–34.0)
MCHC: 33.4 g/dL (ref 30.0–36.0)
MCV: 97.7 fL (ref 80.0–100.0)
Monocytes Absolute: 0.8 10*3/uL (ref 0.1–1.0)
Monocytes Relative: 9 %
Neutro Abs: 7 10*3/uL (ref 1.7–7.7)
Neutrophils Relative %: 77 %
Platelets: 171 10*3/uL (ref 150–400)
RBC: 4.38 MIL/uL (ref 4.22–5.81)
RDW: 15.4 % (ref 11.5–15.5)
WBC: 9.2 10*3/uL (ref 4.0–10.5)
nRBC: 0 % (ref 0.0–0.2)

## 2020-05-08 LAB — BASIC METABOLIC PANEL
Anion gap: 8 (ref 5–15)
BUN: 10 mg/dL (ref 8–23)
CO2: 26 mmol/L (ref 22–32)
Calcium: 9 mg/dL (ref 8.9–10.3)
Chloride: 104 mmol/L (ref 98–111)
Creatinine, Ser: 1.17 mg/dL (ref 0.61–1.24)
GFR, Estimated: >60 mL/min (ref >60)
Glucose, Bld: 89 mg/dL (ref 70–99)
Potassium: 3.9 mmol/L (ref 3.5–5.1)
Sodium: 138 mmol/L (ref 135–145)

## 2020-05-08 MED ORDER — POVIDONE-IODINE 10 % EX SOLN
CUTANEOUS | Status: AC
  Filled 2020-05-08: qty 15

## 2020-05-08 MED ORDER — CEFAZOLIN SODIUM 1 G IJ SOLR: 2.0000 g | Freq: Once | INTRAMUSCULAR | Status: DC

## 2020-05-08 MED ORDER — CEFAZOLIN SODIUM-DEXTROSE 1-4 GM/50ML-% IV SOLN: 1.0000 g | Freq: Once | INTRAVENOUS | Status: DC

## 2020-05-08 MED ORDER — POVIDONE-IODINE 10 % EX OINT
TOPICAL_OINTMENT | Freq: Once | CUTANEOUS | Status: DC
  Filled 2020-05-08: qty 28.35

## 2020-05-08 MED ORDER — OXYCODONE-ACETAMINOPHEN 5-325 MG PO TABS
1.0000 | ORAL_TABLET | ORAL | 0 refills | Status: DC | PRN
Start: 2020-05-08 — End: 2020-05-10

## 2020-05-08 MED ORDER — CEFAZOLIN SODIUM-DEXTROSE 2-4 GM/100ML-% IV SOLN
2.0000 g | Freq: Once | INTRAVENOUS | Status: AC
  Administered 2020-05-08: 2 g via INTRAVENOUS
  Filled 2020-05-08: qty 100

## 2020-05-08 MED ORDER — TETANUS-DIPHTH-ACELL PERTUSSIS 5-2.5-18.5 LF-MCG/0.5 IM SUSY
0.5000 mL | PREFILLED_SYRINGE | Freq: Once | INTRAMUSCULAR | Status: AC
  Administered 2020-05-08: 0.5 mL via INTRAMUSCULAR
  Filled 2020-05-08: qty 0.5

## 2020-05-08 MED ORDER — LIDOCAINE HCL (PF) 2 % IJ SOLN
INTRAMUSCULAR | Status: AC
  Administered 2020-05-08: 5 mL via INTRADERMAL
  Filled 2020-05-08: qty 10

## 2020-05-08 MED ORDER — HYDROMORPHONE HCL 1 MG/ML IJ SOLN
1.0000 mg | Freq: Once | INTRAMUSCULAR | Status: AC
  Administered 2020-05-08: 1 mg via INTRAVENOUS
  Filled 2020-05-08: qty 1

## 2020-05-08 MED ORDER — ONDANSETRON HCL 4 MG/2ML IJ SOLN
4.0000 mg | Freq: Once | INTRAMUSCULAR | Status: AC
  Administered 2020-05-08: 4 mg via INTRAVENOUS
  Filled 2020-05-08: qty 2

## 2020-05-08 MED ORDER — LIDOCAINE HCL (PF) 2 % IJ SOLN: 5.0000 mL | Freq: Once | INTRAMUSCULAR | Status: AC

## 2020-05-08 NOTE — ED Provider Notes (Signed)
[facility] EMERGENCY DEPARTMENT Provider Note   CSN: [id number] Arrival date & time: 05/08/20  1621     History Chief Complaint  Patient presents with  . Hand Pain    Nathaniel Park is a 64 y.o. male.  HPI      Nathaniel Park is a 64 y.o. male who presents to the Emergency Department with a obvious open fracture of the left index finger. He states that he was using a sledgehammer when the hammer slipped striking his finger. Incident occurred around 4 PM today. He complains of pain to the mid finger and he is unable to move the distal finger.  Reports sensation to distal finger.  Last tetanus is unknown.  Denies pain to the remaining fingers or thumb.  He is right hand dominant.   Past Medical History:  Diagnosis Date  . Depression     Patient Active Problem List   Diagnosis Date Noted  . Schizophrenia (HCC) 04/15/2018    Past Surgical History:  Procedure Laterality Date  . INCISE AND DRAIN ABCESS         History reviewed. No pertinent family history.  Social History   Tobacco Use  . Smoking status: Current Every Day Smoker    Packs/day: 1.00    Types: Cigarettes  . Smokeless tobacco: Never Used  Vaping Use  . Vaping Use: Never used  Substance Use Topics  . Alcohol use: Not Currently  . Drug use: Not Currently    Home Medications Prior to Admission medications   Medication Sig Start Date End Date Taking? Authorizing Provider  nicotine (NICODERM CQ - DOSED IN MG/24 HOURS) 21 mg/24hr patch Place 1 patch (21 mg total) onto the skin daily at 6 (six) AM. (May buy from over the counter): For smoking cessation 04/20/18   Armandina Stammer I, NP  perphenazine (TRILAFON) 16 MG tablet Take 1 tablet (16 mg total) by mouth at bedtime. For mood control 04/19/18   Armandina Stammer I, NP  traZODone (DESYREL) 50 MG tablet Take 1 tablet (50 mg total) by mouth at bedtime as needed for sleep. 04/19/18   Armandina Stammer I, NP  venlafaxine XR (EFFEXOR-XR) 75 MG 24 hr capsule Take 1 capsule  (75 mg total) by mouth daily with breakfast. For depression 04/20/18   Sanjuana Kava, NP    Allergies    Patient has no known allergies.  Review of Systems   Review of Systems  Constitutional: Negative for chills and fever.  Cardiovascular: Negative for chest pain.  Gastrointestinal: Negative for abdominal pain, nausea and vomiting.  Musculoskeletal: Positive for arthralgias (left index finger laceration and deformity). Negative for joint swelling.  Neurological: Negative for dizziness, weakness and numbness.  Hematological: Does not bruise/bleed easily.    Physical Exam Updated Vital Signs BP (!) 161/92 (BP Location: Right Arm)   Pulse 75   Temp 99 F (37.2 C) (Oral)   Resp 16   Ht 6' (1.829 m)   Wt 79.4 kg   SpO2 100%   BMI 23.73 kg/m   Physical Exam Vitals and nursing note reviewed.  Constitutional:      Appearance: Normal appearance. He is not ill-appearing.  Cardiovascular:     Rate and Rhythm: Normal rate and regular rhythm.     Pulses: Normal pulses.  Pulmonary:     Effort: Pulmonary effort is normal.     Breath sounds: Normal breath sounds.  Musculoskeletal:        General: Tenderness, deformity and signs of injury present.  Comments: Obvious open fracture deformity involving the PIP joint of the left index finger.  Mild bleeding noted.  See attached photo  Skin:    General: Skin is warm.     Capillary Refill: Capillary refill takes less than 2 seconds.  Neurological:     Mental Status: He is alert.       Pt gave verbal consent to have image stored in his medical record.     ED Results / Procedures / Treatments   Labs (all labs ordered are listed, but only abnormal results are displayed) Labs Reviewed  BASIC METABOLIC PANEL  CBC WITH DIFFERENTIAL/PLATELET    EKG None  Radiology DG Hand Complete Left  Result Date: 05/08/2020 CLINICAL DATA:  Right index finger hit with a hammer. EXAM: LEFT HAND - COMPLETE 3+ VIEW COMPARISON:  None.  FINDINGS: Markedly comminuted and markedly displaced intra-articular fracture of the base, body, and neck of the middle second phalanx. The fracture extends to the proximal interphalangeal joint. There is impaction of the fracture on either side (volar and dorsal) of the proximal phalangeal head that is best visualized on lateral view. Finger appears to be in constant flexion suggesting ligamentous injury. Associated subcutaneus soft tissue edema and emphysema. IMPRESSION: Open, markedly comminuted, markedly displaced, impacted, intra-articular fracture of the base and body of the middle second phalanx. Finger appears to be in constant flexion suggesting ligamentous injury. Electronically Signed   By: Tish Frederickson M.D.   On: 05/08/2020 17:37    Procedures .Ortho Injury Treatment  Date/Time: 05/08/2020 7:35 AM Performed by: Pauline Aus, PA-C Authorized by: Pauline Aus, PA-C   Consent:    Consent obtained:  Verbal   Consent given by:  Patient   Risks discussed:  Irreducible dislocation and vascular damageInjury location: finger Location details: left index finger Injury type: open fracture. Pre-procedure neurovascular assessment: neurovascularly intact Pre-procedure distal perfusion: normal Pre-procedure range of motion: reduced Anesthesia: digital block  Anesthesia: Local anesthesia used: yes Local Anesthetic: lidocaine 2% without epinephrine Anesthetic total: 3 mL  Patient sedated: NoImmobilization: splint Splint type: static finger Supplies used: aluminum splint Post-procedure neurovascular assessment: post-procedure neurovascularly intact Post-procedure distal perfusion: normal Post-procedure range of motion: unchanged Patient tolerance: patient tolerated the procedure well with no immediate complications    (including critical care time)  Medications Ordered in ED Medications  povidone-iodine (BETADINE) 10 % ointment ( Topical Not Given 05/08/20 2007)  Tdap  (BOOSTRIX) injection 0.5 mL (0.5 mLs Intramuscular Given 05/08/20 1908)  HYDROmorphone (DILAUDID) injection 1 mg (1 mg Intravenous Given 05/08/20 1838)  ondansetron (ZOFRAN) injection 4 mg (4 mg Intravenous Given 05/08/20 1834)  ceFAZolin (ANCEF) IVPB 2g/100 mL premix (0 g Intravenous Stopped 05/08/20 1925)  povidone-iodine (BETADINE) 10 % external solution (  Given 05/08/20 1925)  lidocaine HCl (PF) (XYLOCAINE) 2 % injection 5 mL (5 mLs Intradermal Given by Other 05/08/20 1934)    ED Course  I have reviewed the triage vital signs and the nursing notes.  Pertinent labs & imaging results that were available during my care of the patient were reviewed by me and considered in my medical decision making (see chart for details).    MDM Rules/Calculators/A&P                          Patient with significant open fracture involving the mid left index finger.  Distal sensation seems intact, distal extremity warm .  Patient will be given IV antibiotics, tetanus updated and labs.  1815  Consulted orthopedics, Dr. Romeo Apple and discussed findings.  He will review XR findings and call back.    8242  Discussed care plan with Dr. Romeo Apple.  He recommends saline irrigation, saline gauze and finger splinted in extension.  He will see pt in the morning in his office.     Final Clinical Impression(s) / ED Diagnoses Final diagnoses:  Open displaced fracture of middle phalanx of left index finger, initial encounter    Rx / DC Orders ED Discharge Orders    None       Pauline Aus, PA-C 05/09/20 0018    Dione Booze, MD 05/12/20 2241

## 2020-05-08 NOTE — ED Triage Notes (Signed)
Pt c/o left index finger pain due to hitting it with a hammer while it was on a pipe at work. Bleeding controlled.

## 2020-05-08 NOTE — Discharge Instructions (Addendum)
Keep your finger wrapped and splinted.  You may elevate your left hand on a pillow.  As discussed, Dr. Romeo Apple will see you tomorrow morning in his office.

## 2020-05-08 NOTE — ED Provider Notes (Incomplete)
64 year old male injured his left index finger when he hit it with a sledgehammer.  On exam, there is obvious bone coming through the skin and marked deformity at the level of the PIP joint.  Will be given cefazolin and tetanus updated and hand surgery consulted.

## 2020-05-09 ENCOUNTER — Ambulatory Visit (HOSPITAL_COMMUNITY): Payer: PRIVATE HEALTH INSURANCE | Admitting: Certified Registered"

## 2020-05-09 ENCOUNTER — Encounter: Payer: Self-pay | Admitting: Orthopedic Surgery

## 2020-05-09 ENCOUNTER — Ambulatory Visit (INDEPENDENT_AMBULATORY_CARE_PROVIDER_SITE_OTHER): Payer: Worker's Compensation | Admitting: Orthopedic Surgery

## 2020-05-09 ENCOUNTER — Ambulatory Visit (HOSPITAL_COMMUNITY)
Admission: RE | Admit: 2020-05-09 | Discharge: 2020-05-09 | Disposition: A | Discharge status: Home / Self Care | Payer: PRIVATE HEALTH INSURANCE | Source: Ambulatory Visit | Attending: Orthopedic Surgery | Admitting: Orthopedic Surgery

## 2020-05-09 ENCOUNTER — Other Ambulatory Visit: Payer: Self-pay

## 2020-05-09 ENCOUNTER — Encounter (HOSPITAL_COMMUNITY): Payer: Self-pay | Admitting: Orthopedic Surgery

## 2020-05-09 ENCOUNTER — Encounter (HOSPITAL_COMMUNITY)
Admission: RE | Disposition: A | Discharge status: Home / Self Care | Payer: Self-pay | Source: Ambulatory Visit | Attending: Orthopedic Surgery

## 2020-05-09 VITALS — BP 136/71 | HR 65 | Ht 72.0 in | Wt 175.0 lb

## 2020-05-09 DIAGNOSIS — S62621B Displaced fracture of medial phalanx of left index finger, initial encounter for open fracture: Secondary | ICD-10-CM

## 2020-05-09 DIAGNOSIS — F17210 Nicotine dependence, cigarettes, uncomplicated: Secondary | ICD-10-CM | POA: Insufficient documentation

## 2020-05-09 DIAGNOSIS — S67191A Crushing injury of left index finger, initial encounter: Secondary | ICD-10-CM | POA: Diagnosis not present

## 2020-05-09 DIAGNOSIS — Z20822 Contact with and (suspected) exposure to covid-19: Secondary | ICD-10-CM | POA: Diagnosis not present

## 2020-05-09 DIAGNOSIS — Z79899 Other long term (current) drug therapy: Secondary | ICD-10-CM | POA: Diagnosis not present

## 2020-05-09 DIAGNOSIS — Y9389 Activity, other specified: Secondary | ICD-10-CM | POA: Diagnosis not present

## 2020-05-09 DIAGNOSIS — S62621A Displaced fracture of medial phalanx of left index finger, initial encounter for closed fracture: Secondary | ICD-10-CM | POA: Diagnosis present

## 2020-05-09 DIAGNOSIS — W208XXA Other cause of strike by thrown, projected or falling object, initial encounter: Secondary | ICD-10-CM | POA: Diagnosis not present

## 2020-05-09 HISTORY — PX: EXTERNAL FIXATION OF FINGER: SHX6745

## 2020-05-09 HISTORY — PX: I & D EXTREMITY: SHX5045

## 2020-05-09 LAB — SARS CORONAVIRUS 2 BY RT PCR (HOSPITAL ORDER, PERFORMED IN ~~LOC~~ HOSPITAL LAB): SARS Coronavirus 2: NEGATIVE

## 2020-05-09 SURGERY — EXTERNAL FIXATION, FINGER
Anesthesia: General | Site: Finger | Laterality: Left

## 2020-05-09 MED ORDER — PROPOFOL 10 MG/ML IV BOLUS
INTRAVENOUS | Status: AC
  Filled 2020-05-09: qty 20

## 2020-05-09 MED ORDER — CHLORHEXIDINE GLUCONATE 4 % EX LIQD: 60.0000 mL | Freq: Once | CUTANEOUS | Status: DC

## 2020-05-09 MED ORDER — PHENYLEPHRINE 40 MCG/ML (10ML) SYRINGE FOR IV PUSH (FOR BLOOD PRESSURE SUPPORT)
PREFILLED_SYRINGE | INTRAVENOUS | Status: AC
  Filled 2020-05-09: qty 30

## 2020-05-09 MED ORDER — PROPOFOL 10 MG/ML IV BOLUS
INTRAVENOUS | Status: AC
  Filled 2020-05-09: qty 40

## 2020-05-09 MED ORDER — CHLORHEXIDINE GLUCONATE 0.12 % MT SOLN
15.0000 mL | Freq: Once | OROMUCOSAL | Status: AC
  Administered 2020-05-09: 15 mL via OROMUCOSAL
  Filled 2020-05-09: qty 15

## 2020-05-09 MED ORDER — FENTANYL CITRATE (PF) 250 MCG/5ML IJ SOLN
INTRAMUSCULAR | Status: DC | PRN
  Administered 2020-05-09: 50 ug via INTRAVENOUS
  Administered 2020-05-09: 100 ug via INTRAVENOUS
  Administered 2020-05-09: 50 ug via INTRAVENOUS

## 2020-05-09 MED ORDER — SUCCINYLCHOLINE CHLORIDE 200 MG/10ML IV SOSY
PREFILLED_SYRINGE | INTRAVENOUS | Status: AC
  Filled 2020-05-09: qty 10

## 2020-05-09 MED ORDER — LACTATED RINGERS IV SOLN: INTRAVENOUS | Status: DC

## 2020-05-09 MED ORDER — LACTATED RINGERS IV SOLN: INTRAVENOUS | Status: DC | PRN

## 2020-05-09 MED ORDER — FENTANYL CITRATE (PF) 100 MCG/2ML IJ SOLN: 25.0000 ug | INTRAMUSCULAR | Status: DC | PRN

## 2020-05-09 MED ORDER — OXYCODONE HCL 5 MG PO TABS
ORAL_TABLET | ORAL | Status: AC
  Filled 2020-05-09: qty 1

## 2020-05-09 MED ORDER — BUPIVACAINE HCL (PF) 0.25 % IJ SOLN
INTRAMUSCULAR | Status: AC
  Filled 2020-05-09: qty 30

## 2020-05-09 MED ORDER — POVIDONE-IODINE 10 % EX SWAB
2.0000 "application " | Freq: Once | CUTANEOUS | Status: AC
  Administered 2020-05-09: 2 via TOPICAL

## 2020-05-09 MED ORDER — HYDROCODONE-ACETAMINOPHEN 5-325 MG PO TABS
ORAL_TABLET | ORAL | 0 refills | Status: DC
Start: 2020-05-09 — End: 2020-06-26

## 2020-05-09 MED ORDER — ONDANSETRON HCL 4 MG/2ML IJ SOLN
INTRAMUSCULAR | Status: AC
  Filled 2020-05-09: qty 8

## 2020-05-09 MED ORDER — BUPIVACAINE HCL (PF) 0.25 % IJ SOLN
INTRAMUSCULAR | Status: DC | PRN
  Administered 2020-05-09: 10 mL

## 2020-05-09 MED ORDER — CEFAZOLIN SODIUM-DEXTROSE 2-4 GM/100ML-% IV SOLN
2.0000 g | INTRAVENOUS | Status: DC
  Filled 2020-05-09: qty 100

## 2020-05-09 MED ORDER — ONDANSETRON HCL 4 MG/2ML IJ SOLN
INTRAMUSCULAR | Status: DC | PRN
  Administered 2020-05-09: 4 mg via INTRAVENOUS

## 2020-05-09 MED ORDER — FENTANYL CITRATE (PF) 250 MCG/5ML IJ SOLN
INTRAMUSCULAR | Status: AC
  Filled 2020-05-09: qty 5

## 2020-05-09 MED ORDER — MIDAZOLAM HCL 2 MG/2ML IJ SOLN
INTRAMUSCULAR | Status: AC
  Filled 2020-05-09: qty 2

## 2020-05-09 MED ORDER — OXYCODONE HCL 5 MG PO TABS
5.0000 mg | ORAL_TABLET | Freq: Once | ORAL | Status: AC | PRN
  Administered 2020-05-09: 5 mg via ORAL

## 2020-05-09 MED ORDER — SUCCINYLCHOLINE CHLORIDE 200 MG/10ML IV SOSY
PREFILLED_SYRINGE | INTRAVENOUS | Status: AC
  Filled 2020-05-09: qty 20

## 2020-05-09 MED ORDER — PROPOFOL 10 MG/ML IV BOLUS
INTRAVENOUS | Status: DC | PRN
  Administered 2020-05-09: 160 mg via INTRAVENOUS

## 2020-05-09 MED ORDER — OXYCODONE HCL 5 MG/5ML PO SOLN: 5.0000 mg | Freq: Once | ORAL | Status: AC | PRN

## 2020-05-09 MED ORDER — 0.9 % SODIUM CHLORIDE (POUR BTL) OPTIME
TOPICAL | Status: DC | PRN
  Administered 2020-05-09: 3000 mL

## 2020-05-09 MED ORDER — PHENYLEPHRINE HCL (PRESSORS) 10 MG/ML IV SOLN
INTRAVENOUS | Status: DC | PRN
  Administered 2020-05-09: 80 ug via INTRAVENOUS

## 2020-05-09 MED ORDER — PROMETHAZINE HCL 25 MG/ML IJ SOLN: 6.2500 mg | INTRAMUSCULAR | Status: DC | PRN

## 2020-05-09 MED ORDER — SULFAMETHOXAZOLE-TRIMETHOPRIM 800-160 MG PO TABS: 1.0000 | ORAL_TABLET | Freq: Two times a day (BID) | ORAL | 0 refills | Status: DC

## 2020-05-09 MED ORDER — MIDAZOLAM HCL 5 MG/5ML IJ SOLN
INTRAMUSCULAR | Status: DC | PRN
  Administered 2020-05-09: 1 mg via INTRAVENOUS

## 2020-05-09 MED ORDER — ORAL CARE MOUTH RINSE: 15.0000 mL | Freq: Once | OROMUCOSAL | Status: AC

## 2020-05-09 MED ORDER — LIDOCAINE HCL (CARDIAC) PF 100 MG/5ML IV SOSY
PREFILLED_SYRINGE | INTRAVENOUS | Status: DC | PRN
  Administered 2020-05-09: 60 mg via INTRATRACHEAL

## 2020-05-09 MED ORDER — ONDANSETRON HCL 4 MG/2ML IJ SOLN
INTRAMUSCULAR | Status: AC
  Filled 2020-05-09: qty 2

## 2020-05-09 MED ORDER — CEFAZOLIN SODIUM-DEXTROSE 2-3 GM-%(50ML) IV SOLR
INTRAVENOUS | Status: DC | PRN
  Administered 2020-05-09: 2 g via INTRAVENOUS

## 2020-05-09 MED ORDER — EPHEDRINE 5 MG/ML INJ
INTRAVENOUS | Status: AC
  Filled 2020-05-09: qty 20

## 2020-05-09 MED ORDER — DEXAMETHASONE SODIUM PHOSPHATE 10 MG/ML IJ SOLN
INTRAMUSCULAR | Status: AC
  Filled 2020-05-09: qty 1

## 2020-05-09 MED FILL — Hydrocodone-Acetaminophen Tab 5-325 MG: ORAL | Qty: 6 | Status: AC

## 2020-05-09 SURGICAL SUPPLY — 56 items
APL PRP STRL LF DISP 70% ISPRP (MISCELLANEOUS) ×1
APL SKNCLS STERI-STRIP NONHPOA (GAUZE/BANDAGES/DRESSINGS) ×1
ASMB ORTH HSNG BN (EXFIX) ×2
ASSEMBLY HOUS SM BONE ADJ CLMP (EXFIX) ×2 IMPLANT
BENZOIN TINCTURE PRP APPL 2/3 (GAUZE/BANDAGES/DRESSINGS) ×2 IMPLANT
BLADE CLIPPER SURG (BLADE) ×1 IMPLANT
BNDG CMPR 9X4 STRL LF SNTH (GAUZE/BANDAGES/DRESSINGS) ×1
BNDG COHESIVE 2X5 TAN STRL LF (GAUZE/BANDAGES/DRESSINGS) IMPLANT
BNDG ELASTIC 3X5.8 VLCR STR LF (GAUZE/BANDAGES/DRESSINGS) ×2 IMPLANT
BNDG ELASTIC 4X5.8 VLCR STR LF (GAUZE/BANDAGES/DRESSINGS) ×2 IMPLANT
BNDG ESMARK 4X9 LF (GAUZE/BANDAGES/DRESSINGS) ×2 IMPLANT
BNDG GAUZE ELAST 4 BULKY (GAUZE/BANDAGES/DRESSINGS) ×2 IMPLANT
CHLORAPREP W/TINT 26 (MISCELLANEOUS) ×2 IMPLANT
CORD BIPOLAR FORCEPS 12FT (ELECTRODE) ×2 IMPLANT
COVER SURGICAL LIGHT HANDLE (MISCELLANEOUS) ×2 IMPLANT
COVER WAND RF STERILE (DRAPES) ×2 IMPLANT
CUFF TOURN SGL QUICK 18X4 (TOURNIQUET CUFF) ×1 IMPLANT
CUFF TOURN SGL QUICK 24 (TOURNIQUET CUFF)
CUFF TRNQT CYL 24X4X16.5-23 (TOURNIQUET CUFF) IMPLANT
DECANTER SPIKE VIAL GLASS SM (MISCELLANEOUS) ×1 IMPLANT
DRAPE OEC MINIVIEW 54X84 (DRAPES) ×3 IMPLANT
DRAPE SURG 17X23 STRL (DRAPES) ×2 IMPLANT
DRSG EMULSION OIL 3X3 NADH (GAUZE/BANDAGES/DRESSINGS) ×1 IMPLANT
DRSG XEROFORM 1X8 (GAUZE/BANDAGES/DRESSINGS) ×1 IMPLANT
GAUZE SPONGE 4X4 12PLY STRL (GAUZE/BANDAGES/DRESSINGS) ×2 IMPLANT
GAUZE XEROFORM 1X8 LF (GAUZE/BANDAGES/DRESSINGS) ×1 IMPLANT
GLIDEWIRE ORTHO STT 1.5X4 (WIRE) ×4 IMPLANT
GLOVE BIO SURGEON STRL SZ7.5 (GLOVE) ×2 IMPLANT
GLOVE BIOGEL PI IND STRL 8 (GLOVE) ×1 IMPLANT
GLOVE BIOGEL PI INDICATOR 8 (GLOVE) ×1
GOWN STRL REUS W/ TWL LRG LVL3 (GOWN DISPOSABLE) ×3 IMPLANT
GOWN STRL REUS W/TWL LRG LVL3 (GOWN DISPOSABLE) ×6
KIT BASIN OR (CUSTOM PROCEDURE TRAY) ×2 IMPLANT
KIT TURNOVER KIT B (KITS) ×2 IMPLANT
LOOP VESSEL MAXI BLUE (MISCELLANEOUS) ×1 IMPLANT
MANIFOLD NEPTUNE II (INSTRUMENTS) ×2 IMPLANT
NDL HYPO 25GX1X1/2 BEV (NEEDLE) ×1 IMPLANT
NEEDLE HYPO 25GX1X1/2 BEV (NEEDLE) ×2 IMPLANT
NS IRRIG 1000ML POUR BTL (IV SOLUTION) ×2 IMPLANT
PACK ORTHO EXTREMITY (CUSTOM PROCEDURE TRAY) ×2 IMPLANT
PAD ARMBOARD 7.5X6 YLW CONV (MISCELLANEOUS) ×4 IMPLANT
PAD CAST 4YDX4 CTTN HI CHSV (CAST SUPPLIES) IMPLANT
PADDING CAST COTTON 4X4 STRL (CAST SUPPLIES) ×2
ROD SM BONE FX SHAFT 90MM (EXFIX) ×1 IMPLANT
SLING ARM FOAM STRAP LRG (SOFTGOODS) ×1 IMPLANT
STRIP CLOSURE SKIN 1/2X4 (GAUZE/BANDAGES/DRESSINGS) ×1 IMPLANT
SUCTION FRAZIER HANDLE 10FR (MISCELLANEOUS)
SUCTION TUBE FRAZIER 10FR DISP (MISCELLANEOUS) ×1 IMPLANT
SUT ETHILON 4 0 P 3 18 (SUTURE) ×1 IMPLANT
SUT PROLENE 4 0 P 3 18 (SUTURE) IMPLANT
SYR CONTROL 10ML LL (SYRINGE) ×1 IMPLANT
TOWEL GREEN STERILE (TOWEL DISPOSABLE) ×2 IMPLANT
TOWEL GREEN STERILE FF (TOWEL DISPOSABLE) ×2 IMPLANT
TUBE CONNECTING 12X1/4 (SUCTIONS) ×2 IMPLANT
TUBE FEEDING ENTERAL 5FR 16IN (TUBING) IMPLANT
WATER STERILE IRR 1000ML POUR (IV SOLUTION) ×2 IMPLANT

## 2020-05-09 NOTE — Anesthesia Postprocedure Evaluation (Signed)
Anesthesia Post Note  Patient: Nathaniel Park  Procedure(s) Performed: EXTERNAL FIXATION OF FINGER (Left Finger) IRRIGATION AND DEBRIDEMENT left index finger (Left Finger)     Patient location during evaluation: PACU Anesthesia Type: General Level of consciousness: awake and alert Pain management: pain level controlled Vital Signs Assessment: post-procedure vital signs reviewed and stable Respiratory status: spontaneous breathing, nonlabored ventilation and respiratory function stable Cardiovascular status: blood pressure returned to baseline and stable Postop Assessment: no apparent nausea or vomiting Anesthetic complications: no   No complications documented.  Last Vitals:  Vitals:   05/09/20 2135 05/09/20 2150  BP: (!) 167/85 (!) 145/89  Pulse: 95 81  Resp: 16 12  Temp:  36.7 C  SpO2: 95% 98%    Last Pain:  Vitals:   05/09/20 2148  TempSrc:   PainSc: 4                  Beryle Lathe

## 2020-05-09 NOTE — Progress Notes (Signed)
Chief Complaint  Patient presents with  . Finger Injury    Left index finger vs hammer    ER follow up   64 yo male smoker at work for [employer]   He's RHD   He hit his left index finger with a small heavy hammer and that caused an open fracture of the middle phalanx.  He was given ancef in the ER and digital block followed by irrigation and coverage of the wound with moist saline gauze, then splinted in extension per my instructions.  He c/o loss of sensation of the dorsal radial portion of the finger distal to the laceration  The laceration is 2 cm oblique over the joint.  Review of Systems  Constitutional: Negative for chills and fever.  Respiratory: Negative for cough.   Cardiovascular: Negative for chest pain.  Musculoskeletal: Positive for joint pain.  Skin: Negative for itching and rash.   Past Medical History:  Diagnosis Date  . Depression    Past Surgical History:  Procedure Laterality Date  . INCISE AND DRAIN ABCESS      Current Outpatient Medications:  .  oxyCODONE-acetaminophen (PERCOCET/ROXICET) 5-325 MG tablet, Take 1 tablet by mouth every 4 (four) hours as needed for severe pain., Disp: 6 tablet, Rfl: 0 .  perphenazine (TRILAFON) 16 MG tablet, Take 1 tablet (16 mg total) by mouth at bedtime. For mood control, Disp: 30 tablet, Rfl: 0   History reviewed. No pertinent family history. Social History   Tobacco Use  . Smoking status: Current Every Day Smoker    Packs/day: 1.00    Types: Cigarettes  . Smokeless tobacco: Never Used  Vaping Use  . Vaping Use: Never used  Substance Use Topics  . Alcohol use: Not Currently  . Drug use: Not Currently    BP 136/71   Pulse 65   Ht 6' (1.829 m)   Wt 175 lb (79.4 kg)   BMI 23.73 kg/m   Physical Exam Constitutional:      General: He is not in acute distress.    Appearance: Normal appearance. He is normal weight. He is not ill-appearing or diaphoretic.  HENT:     Head: Normocephalic  and atraumatic.     Nose: No congestion or rhinorrhea.     Mouth/Throat:     Mouth: Mucous membranes are dry.  Eyes:     General: No scleral icterus.    Extraocular Movements: Extraocular movements intact.     Conjunctiva/sclera: Conjunctivae normal.     Pupils: Pupils are equal, round, and reactive to light.  Cardiovascular:     Rate and Rhythm: Normal rate and regular rhythm.     Pulses: Normal pulses.  Pulmonary:     Effort: Pulmonary effort is normal.  Musculoskeletal:     Left hand: Deformity, laceration and tenderness present. Decreased range of motion. Decreased sensation. There is no disruption of two-point discrimination. Normal capillary refill.     Cervical back: Neck supple. No rigidity.     Comments: Note: the dorsal radial side of the left index finger has loss of sensation, however he has normal sensation radially and entire volar surface  As far as tendon function goes, the flexor tendons are intact  The extensor tendon is weak    Neurological:     Mental Status: He is alert and oriented to person, place, and time.  Psychiatric:        Mood and Affect: Mood normal.        Behavior: Behavior  normal.        Thought Content: Thought content normal.    Outside xrays from [facility]   Comminuted fracture of the middle phalanx left index finger   With shortening and extension into the PIP joint   Dr Betha Loa was kind enough to provide discussion of the case. He and I agree that the finger is viable due to the intact nerve and vascular supply   However due to the nature and "personality " of the  injury amputation vs salvage is the decision that must be made   In the interest of shared decision making after thorough discussion with the patient, he has decided to try to save the finger   Therefore as discussed with Dr Betha Loa the px will go to the ER at [facility]

## 2020-05-09 NOTE — H&P (Signed)
Nathaniel Park is an 64 y.o. male.   Chief Complaint: left index finger crush injury HPI: 64 yo male states he smashed left index finger with sledge hammer yesterday.  Seen at [facility] where XR revealed fracture of middle phalanx left index finger.  Open wound with exposed bone.  Wound cleaned and dressed.  Followed up with Dr. Romeo Apple in office this morning and referred for further care.  He reports no previous injury to finger and no other injury at this time except that long finger with small amount of pain.  Case discussed with Dr. Romeo Apple and his note from 05/09/2020 reviewed. Xrays viewed and interpreted by me: 3 views left hand show comminuted intraarticular fracture of middle phalanx  Labs reviewed: none  Allergies: No Known Allergies  Past Medical History:  Diagnosis Date  . Depression     Past Surgical History:  Procedure Laterality Date  . INCISE AND DRAIN ABCESS      Family History: History reviewed. No pertinent family history.  Social History:   reports that he has been smoking cigarettes. He has been smoking about 1.00 pack per day. He has never used smokeless tobacco. He reports previous alcohol use. He reports previous drug use.  Medications: Medications Prior to Admission  Medication Sig Dispense Refill  . oxyCODONE-acetaminophen (PERCOCET/ROXICET) 5-325 MG tablet Take 1 tablet by mouth every 4 (four) hours as needed for severe pain. 6 tablet 0  . perphenazine (TRILAFON) 16 MG tablet Take 1 tablet (16 mg total) by mouth at bedtime. For mood control 30 tablet 0    Results for orders placed or performed during the hospital encounter of 05/09/20 (from the past 48 hour(s))  SARS Coronavirus 2 by RT PCR (hospital order, performed in [facility] hospital lab) Nasopharyngeal Nasopharyngeal Swab     Status: None   Collection Time: 05/09/20 11:57 AM   Specimen: Nasopharyngeal Swab  Result Value Ref Range   SARS Coronavirus 2 NEGATIVE NEGATIVE    Comment: (NOTE) SARS-CoV-2  target nucleic acids are NOT DETECTED.  The SARS-CoV-2 RNA is generally detectable in upper and lower respiratory specimens during the acute phase of infection. The lowest concentration of SARS-CoV-2 viral copies this assay can detect is 250 copies / mL. A negative result does not preclude SARS-CoV-2 infection and should not be used as the sole basis for treatment or other patient management decisions.  A negative result may occur with improper specimen collection / handling, submission of specimen other than nasopharyngeal swab, presence of viral mutation(s) within the areas targeted by this assay, and inadequate number of viral copies (<250 copies / mL). A negative result must be combined with clinical observations, patient history, and epidemiological information.  Fact Sheet for Patients:   BoilerBrush.com.cy  Fact Sheet for Healthcare Providers: https://pope.com/  This test is not yet approved or  cleared by the [place] FDA and has been authorized for detection and/or diagnosis of SARS-CoV-2 by FDA under an Emergency Use Authorization (EUA).  This EUA will remain in effect (meaning this test can be used) for the duration of the COVID-19 declaration under Section 564(b)(1) of the Act, 21 U.S.C. section 360bbb-3(b)(1), unless the authorization is terminated or revoked sooner.  Performed at [facility], [address] [place], [place] [address]     DG Hand Complete Left  Result Date: 05/08/2020 CLINICAL DATA:  Right index finger hit with a hammer. EXAM: LEFT HAND - COMPLETE 3+ VIEW COMPARISON:  None. FINDINGS: Markedly comminuted and markedly displaced intra-articular  fracture of the base, body, and neck of the middle second phalanx. The fracture extends to the proximal interphalangeal joint. There is impaction of the fracture on either side (volar and dorsal) of the proximal phalangeal head that is best visualized on  lateral view. Finger appears to be in constant flexion suggesting ligamentous injury. Associated subcutaneus soft tissue edema and emphysema. IMPRESSION: Open, markedly comminuted, markedly displaced, impacted, intra-articular fracture of the base and body of the middle second phalanx. Finger appears to be in constant flexion suggesting ligamentous injury. Electronically Signed   By: Tish Frederickson M.D.   On: 05/08/2020 17:37     A comprehensive review of systems was negative. Review of Systems: No fevers, chills, night sweats, chest pain, shortness of breath, nausea, vomiting, diarrhea, constipation, easy bleeding or bruising, headaches, dizziness, vision changes, fainting.   Blood pressure (!) 145/79, pulse 68, temperature 98.2 F (36.8 C), temperature source Oral, resp. rate 17, height 6' (1.829 m), weight 77.1 kg, SpO2 99 %.  General appearance: alert, cooperative and appears stated age Head: Normocephalic, without obvious abnormality, atraumatic Neck: supple, symmetrical, trachea midline Resp: clear to auscultation bilaterally Cardio: regular rate and rhythm Extremities: Intact sensation and capillary refill all digits.  +epl/fpl/io.  Wound on dorsum of left index finger middle phalanx near pip joint Pulses: 2+ and symmetric Skin: Skin color, texture, turgor normal. No rashes or lesions Neurologic: Grossly normal Incision/Wound: as above  Assessment/Plan Left index finger open comminuted intraarticular fracture of middle phalanx.  Discussed options for management including irrigation and debridement and fixation vs amputation of digit.  He would like to try to salvage the digit.  Plan irrigation and debridement of open middle phalanx fracture with external fixation.  Too much comminution for internal fixation or pin fixation.  Will also likely place drains due to this being open fracture from yesterday.  Discussed potential for infection of open fracture and difficulty with bone healing  as well as possibility of failure of blood flow to digit from crushing injury leading to necrosis. Also anticipate stiffness of digit.  Risks, benefits and alternatives of surgery were discussed including risks of blood loss, infection, damage to nerves/vessels/tendons/ligament/bone, failure of surgery, need for additional surgery, complication with wound healing, stiffness, nonunion, malunion, amputation.  He voiced understanding of these risks and elected to proceed.    Betha Loa 05/09/2020, 7:29 PM

## 2020-05-09 NOTE — Anesthesia Procedure Notes (Signed)
Procedure Name: LMA Insertion Performed by: Sonda Primes, CRNA Pre-anesthesia Checklist: Patient identified, Emergency Drugs available, Suction available, Patient being monitored and Timeout performed Patient Re-evaluated:Patient Re-evaluated prior to induction Oxygen Delivery Method: Circle system utilized Preoxygenation: Pre-oxygenation with 100% oxygen LMA: LMA inserted LMA Size: 5.0 Tube type: Oral Number of attempts: 1 Placement Confirmation: positive ETCO2 and breath sounds checked- equal and bilateral Tube secured with: Tape Dental Injury: Teeth and Oropharynx as per pre-operative assessment

## 2020-05-09 NOTE — Discharge Instructions (Signed)

## 2020-05-09 NOTE — Anesthesia Preprocedure Evaluation (Addendum)
Anesthesia Evaluation  Patient identified by MRN, date of birth, ID band Patient awake    Reviewed: Allergy & Precautions, NPO status , Patient's Chart, lab work & pertinent test results  History of Anesthesia Complications Negative for: history of anesthetic complications  Airway Mallampati: II  TM Distance: >3 FB Neck ROM: Full    Dental  (+) Dental Advisory Given, Partial Upper   Pulmonary Current SmokerPatient did not abstain from smoking.,    Pulmonary exam normal        Cardiovascular negative cardio ROS Normal cardiovascular exam     Neuro/Psych PSYCHIATRIC DISORDERS Depression Schizophrenia negative neurological ROS     GI/Hepatic negative GI ROS, Neg liver ROS,   Endo/Other  negative endocrine ROS  Renal/GU negative Renal ROS     Musculoskeletal negative musculoskeletal ROS (+)   Abdominal   Peds  Hematology negative hematology ROS (+)   Anesthesia Other Findings Covid test negative   Reproductive/Obstetrics                           Anesthesia Physical Anesthesia Plan  ASA: II  Anesthesia Plan: General   Post-op Pain Management:    Induction: Intravenous  PONV Risk Score and Plan: 2 and Treatment may vary due to age or medical condition, Ondansetron and Midazolam  Airway Management Planned: LMA  Additional Equipment: None  Intra-op Plan:   Post-operative Plan: Extubation in OR  Informed Consent: I have reviewed the patients History and Physical, chart, labs and discussed the procedure including the risks, benefits and alternatives for the proposed anesthesia with the patient or authorized representative who has indicated his/her understanding and acceptance.     Dental advisory given  Plan Discussed with: CRNA and Anesthesiologist  Anesthesia Plan Comments:        Anesthesia Quick Evaluation

## 2020-05-09 NOTE — Transfer of Care (Signed)
Immediate Anesthesia Transfer of Care Note  Patient: Nathaniel Park  Procedure(s) Performed: EXTERNAL FIXATION OF FINGER (Left Finger) IRRIGATION AND DEBRIDEMENT left index finger (Left Finger)  Patient Location: PACU  Anesthesia Type:General  Level of Consciousness: drowsy  Airway & Oxygen Therapy: Patient Spontanous Breathing  Post-op Assessment: Report given to RN and Post -op Vital signs reviewed and stable  Post vital signs: Reviewed and stable  Last Vitals:  Vitals Value Taken Time  BP 154/78 05/09/20 2121  Temp 36.1 C 05/09/20 2121  Pulse 80 05/09/20 2125  Resp 14 05/09/20 2125  SpO2 97 % 05/09/20 2125  Vitals shown include unvalidated device data.  Last Pain:  Vitals:   05/09/20 1257  TempSrc:   PainSc: 0-No pain         Complications: No complications documented.

## 2020-05-09 NOTE — Patient Instructions (Addendum)
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

## 2020-05-09 NOTE — Op Note (Signed)
NAME: Nathaniel Park MEDICAL RECORD NO: [id number] DATE OF BIRTH: December 11, 1955 FACILITY: [facility] LOCATION: [facility] OR PHYSICIAN: Tami Ribas, MD   OPERATIVE REPORT   DATE OF PROCEDURE: 05/09/20    PREOPERATIVE DIAGNOSIS:   Left index finger middle phalanx open intra-articular base fracture   POSTOPERATIVE DIAGNOSIS:   Left index finger middle phalanx open intra-articular base fracture   PROCEDURE:   1.  Irrigation and debridement of left index finger middle phalanx fracture including removal of bone 2.  Open reduction with external fixation of left index finger middle phalanx intra-articular base fracture   SURGEON:  Betha Loa, M.D.   ASSISTANT: none   ANESTHESIA:  General   INTRAVENOUS FLUIDS:  Per anesthesia flow sheet.   ESTIMATED BLOOD LOSS:  Minimal.   COMPLICATIONS:  None.   SPECIMENS:  none   TOURNIQUET TIME:    Total Tourniquet Time Documented: Upper Arm (Left) - 66 minutes Total: Upper Arm (Left) - 66 minutes    DISPOSITION:  Stable to PACU.   INDICATIONS: 64 year old male states he smashed his left index finger yesterday at work with a sledgehammer.  He was seen at any pain emergency department where radiographs revealed left index finger middle phalanx comminuted fracture. The wound was cleaned and dressed.  He followed up with Dr. Romeo Apple this morning and was referred for further care.   Risks, benefits and alternatives of surgery were discussed including the risks of blood loss, infection, damage to nerves, vessels, tendons, ligaments, bone for surgery, need for additional surgery, complications with wound healing, continued pain, nonunion, malunion,  stiffness, necrosis.  He voiced understanding of these risks and elected to proceed.  OPERATIVE COURSE:  After being identified preoperatively by myself,  the patient and I agreed on the procedure and site of the procedure.  The surgical site was marked.  Surgical consent had been signed. He was given IV  antibiotics as preoperative antibiotic prophylaxis. He was transferred to the operating room and placed on the operating table in supine position with the Left upper extremity on an arm board.  General anesthesia was induced by the anesthesiologist.  Left upper extremity was prepped and draped in normal sterile orthopedic fashion.  A surgical pause was performed between the surgeons, anesthesia, and operating room staff and all were in agreement as to the patient, procedure, and site of procedure.  Tourniquet at the proximal aspect of the extremity was inflated to 250 mmHg after exsanguination of the arm with an Esmarch bandage.    The wound was explored.  Contaminated hematoma was removed.  There was some fiber he foreign matter which was removed as well.  The skin and subcutaneous tissue edges were debrided with the scissors to remove any devitalized tissue.  Devitalized bone fragments were excised and removed using a knife.  Once debridement of foreign matter was completed the wound and open fracture were copiously irrigated with 3000 cc of sterile saline by bulb syringe.  There was comminution of the middle phalanx with intra-articular extension creating multiple articular fragments at the PIP joint.  There is laceration of the radial side of the extensor tendon.  C-arm was used in AP and lateral projections.  The Acumed external fixator was used.  2 pins were placed in the proximal phalanx through undamaged skin.  The pin was then placed distal to the fracture through the condyle of the middle phalanx in a final through the base of the distal phalanx.  The spacing guide was used.  The external  fixator was placed.  The fracture was reduced under direct visualization and using C-arm guidance.  It was adjusted until acceptable reduction had been obtained.  There is acceptable articular reduction.  There did appear to be some bone loss in the diaphysis.  The skin was then reapproximated using a 4-0 nylon suture.   This was done widely spaced.  Vessel loop drains were placed in the wound and at the fracture site to allow drainage as necessary.  A digital block was performed with quarter percent plain Marcaine to aid in postoperative analgesia.  The wound and pin sites were dressed with sterile Xeroform 4 x 4's and wrapped with a Kerlix bandage.  A volar splint was placed and wrapped with Kerlix and Ace bandage.  The tourniquet was deflated at 66 minutes.  Fingertips were pink with brisk capillary refill after deflation of tourniquet.  The operative  drapes were broken down.  The patient was awoken from anesthesia safely.  He was transferred back to the stretcher and taken to PACU in stable condition.  I will see him back in the office in 1 week for postoperative followup.  I will give him a prescription for Norco 5/325 1-2 tabs PO q6 hours prn pain, dispense # 30 and Bactrim DS 1 p.o. twice daily x14 days.   Betha Loa, MD Electronically signed, 05/09/20

## 2020-05-09 NOTE — Consult Note (Signed)
Reason for Consult:Right index finger injury Referring Physician: Mort Sawyers Time called: 1149 Time at bedside: 1205   Nathaniel Park is an 64 y.o. male.  HPI: Isidore was working and was hammering something. He somehow managed to strike his other hand, he thinks with the handle of the hammer. He had immediate pain and a significant laceration over the dorsum of the finger. He went to [facility] and was bandaged and sent to see the [facility] orthopedist today. It was beyond his scope and he was sent here to [facility] for definitive treatment by Dr. Merlyn Lot. He is RHD and works in maintenance.  Past Medical History:  Diagnosis Date  . Depression     Past Surgical History:  Procedure Laterality Date  . INCISE AND DRAIN ABCESS      No family history on file.  Social History:  reports that he has been smoking cigarettes. He has been smoking about 1.00 pack per day. He has never used smokeless tobacco. He reports previous alcohol use. He reports previous drug use.  Allergies: No Known Allergies  Medications: I have reviewed the patient's current medications.  Results for orders placed or performed during the hospital encounter of 05/08/20 (from the past 48 hour(s))  Basic metabolic panel     Status: None   Collection Time: 05/08/20  6:33 PM  Result Value Ref Range   Sodium 138 135 - 145 mmol/L   Potassium 3.9 3.5 - 5.1 mmol/L   Chloride 104 98 - 111 mmol/L   CO2 26 22 - 32 mmol/L   Glucose, Bld 89 70 - 99 mg/dL    Comment: Glucose reference range applies only to samples taken after fasting for at least 8 hours.   BUN 10 8 - 23 mg/dL   Creatinine, Ser 3.15 0.61 - 1.24 mg/dL   Calcium 9.0 8.9 - 17.6 mg/dL   GFR, Estimated >16 >07 mL/min    Comment: (NOTE) Calculated using the CKD-EPI Creatinine Equation (2021)    Anion gap 8 5 - 15    Comment: Performed at [facility], [address], [place], [place] [address]  CBC with Differential     Status: None   Collection Time: 05/08/20  6:33 PM  Result  Value Ref Range   WBC 9.2 4.0 - 10.5 K/uL   RBC 4.38 4.22 - 5.81 MIL/uL   Hemoglobin 14.3 13.0 - 17.0 g/dL   HCT 26.9 48.5 - 46.2 %   MCV 97.7 80.0 - 100.0 fL   MCH 32.6 26.0 - 34.0 pg   MCHC 33.4 30.0 - 36.0 g/dL   RDW 70.3 50.0 - 93.8 %   Platelets 171 150 - 400 K/uL   nRBC 0.0 0.0 - 0.2 %   Neutrophils Relative % 77 %   Neutro Abs 7.0 1.7 - 7.7 K/uL   Lymphocytes Relative 13 %   Lymphs Abs 1.2 0.7 - 4.0 K/uL   Monocytes Relative 9 %   Monocytes Absolute 0.8 0.1 - 1.0 K/uL   Eosinophils Relative 1 %   Eosinophils Absolute 0.1 0.0 - 0.5 K/uL   Basophils Relative 0 %   Basophils Absolute 0.0 0.0 - 0.1 K/uL   Immature Granulocytes 0 %   Abs Immature Granulocytes 0.02 0.00 - 0.07 K/uL    Comment: Performed at [facility], [address] [place], [place] [address]    DG Hand Complete Left  Result Date: 05/08/2020 CLINICAL DATA:  Right index finger hit with a hammer. EXAM: LEFT HAND - COMPLETE 3+ VIEW  COMPARISON:  None. FINDINGS: Markedly comminuted and markedly displaced intra-articular fracture of the base, body, and neck of the middle second phalanx. The fracture extends to the proximal interphalangeal joint. There is impaction of the fracture on either side (volar and dorsal) of the proximal phalangeal head that is best visualized on lateral view. Finger appears to be in constant flexion suggesting ligamentous injury. Associated subcutaneus soft tissue edema and emphysema. IMPRESSION: Open, markedly comminuted, markedly displaced, impacted, intra-articular fracture of the base and body of the middle second phalanx. Finger appears to be in constant flexion suggesting ligamentous injury. Electronically Signed   By: Tish Frederickson M.D.   On: 05/08/2020 17:37    Review of Systems  HENT: Negative for ear discharge, ear pain, hearing loss and tinnitus.   Eyes: Negative for photophobia and pain.  Respiratory: Negative for cough and shortness of breath.   Cardiovascular: Negative for  chest pain.  Gastrointestinal: Negative for abdominal pain, nausea and vomiting.  Genitourinary: Negative for dysuria, flank pain, frequency and urgency.  Musculoskeletal: Positive for arthralgias (Left index finger). Negative for back pain, myalgias and neck pain.  Neurological: Negative for dizziness and headaches.  Hematological: Does not bruise/bleed easily.  Psychiatric/Behavioral: The patient is not nervous/anxious.    Blood pressure (!) 145/79, pulse 68, temperature 98.2 F (36.8 C), temperature source Oral, resp. rate 17, height 6' (1.829 m), weight 77.1 kg, SpO2 99 %. Physical Exam Constitutional:      General: He is not in acute distress.    Appearance: He is well-developed and well-nourished. He is not diaphoretic.  HENT:     Head: Normocephalic and atraumatic.  Eyes:     General: No scleral icterus.       Right eye: No discharge.        Left eye: No discharge.     Conjunctiva/sclera: Conjunctivae normal.  Cardiovascular:     Rate and Rhythm: Normal rate and regular rhythm.  Pulmonary:     Effort: Pulmonary effort is normal. No respiratory distress.  Musculoskeletal:     Cervical back: Normal range of motion.     Comments: Left shoulder, elbow, wrist, digits- Laceration over PIP joint, mod TTP, sensation intact distally, no instability, no blocks to motion  Sens  Ax/R/M/U intact  Mot   Ax/ R/ PIN/ M/ AIN/ U intact  Rad 2+  Skin:    General: Skin is warm and dry.  Neurological:     Mental Status: He is alert.  Psychiatric:        Mood and Affect: Mood and affect normal.        Behavior: Behavior normal.     Assessment/Plan: Left index finger injury -- Plan ex fix of finger today by Dr. Merlyn Lot. Please keep NPO.    Freeman Caldron, PA-C Orthopedic Surgery [phone] 05/09/2020, 12:14 PM

## 2020-05-10 ENCOUNTER — Encounter (HOSPITAL_COMMUNITY): Payer: Self-pay | Admitting: Orthopedic Surgery

## 2020-05-13 ENCOUNTER — Encounter (HOSPITAL_COMMUNITY): Payer: Self-pay | Admitting: Orthopedic Surgery

## 2020-06-26 ENCOUNTER — Encounter (HOSPITAL_BASED_OUTPATIENT_CLINIC_OR_DEPARTMENT_OTHER): Payer: Self-pay | Admitting: Orthopedic Surgery

## 2020-06-26 ENCOUNTER — Other Ambulatory Visit: Payer: Self-pay | Admitting: Orthopedic Surgery

## 2020-06-26 ENCOUNTER — Other Ambulatory Visit: Payer: Self-pay

## 2020-06-27 ENCOUNTER — Other Ambulatory Visit (HOSPITAL_COMMUNITY)
Admission: RE | Admit: 2020-06-27 | Discharge: 2020-06-27 | Disposition: A | Discharge status: Home / Self Care | Payer: Self-pay | Source: Ambulatory Visit | Attending: Orthopedic Surgery | Admitting: Orthopedic Surgery

## 2020-06-27 DIAGNOSIS — Z20822 Contact with and (suspected) exposure to covid-19: Secondary | ICD-10-CM | POA: Insufficient documentation

## 2020-06-27 DIAGNOSIS — Z01812 Encounter for preprocedural laboratory examination: Secondary | ICD-10-CM | POA: Insufficient documentation

## 2020-06-28 LAB — SARS CORONAVIRUS 2 (TAT 6-24 HRS): SARS Coronavirus 2: NEGATIVE

## 2020-07-01 ENCOUNTER — Ambulatory Visit (HOSPITAL_BASED_OUTPATIENT_CLINIC_OR_DEPARTMENT_OTHER): Payer: PRIVATE HEALTH INSURANCE | Admitting: Anesthesiology

## 2020-07-01 ENCOUNTER — Encounter (HOSPITAL_BASED_OUTPATIENT_CLINIC_OR_DEPARTMENT_OTHER)
Admission: RE | Disposition: A | Discharge status: Home / Self Care | Payer: Self-pay | Source: Home / Self Care | Attending: Orthopedic Surgery

## 2020-07-01 ENCOUNTER — Ambulatory Visit (HOSPITAL_BASED_OUTPATIENT_CLINIC_OR_DEPARTMENT_OTHER)
Admission: RE | Admit: 2020-07-01 | Discharge: 2020-07-01 | Disposition: A | Discharge status: Home / Self Care | Payer: PRIVATE HEALTH INSURANCE | Attending: Orthopedic Surgery | Admitting: Orthopedic Surgery

## 2020-07-01 ENCOUNTER — Other Ambulatory Visit: Payer: Self-pay

## 2020-07-01 ENCOUNTER — Encounter (HOSPITAL_BASED_OUTPATIENT_CLINIC_OR_DEPARTMENT_OTHER): Payer: Self-pay | Admitting: Orthopedic Surgery

## 2020-07-01 DIAGNOSIS — X58XXXD Exposure to other specified factors, subsequent encounter: Secondary | ICD-10-CM | POA: Insufficient documentation

## 2020-07-01 DIAGNOSIS — F17210 Nicotine dependence, cigarettes, uncomplicated: Secondary | ICD-10-CM | POA: Insufficient documentation

## 2020-07-01 DIAGNOSIS — Z79899 Other long term (current) drug therapy: Secondary | ICD-10-CM | POA: Insufficient documentation

## 2020-07-01 DIAGNOSIS — S62621D Displaced fracture of medial phalanx of left index finger, subsequent encounter for fracture with routine healing: Secondary | ICD-10-CM | POA: Diagnosis present

## 2020-07-01 HISTORY — PX: EXTERNAL FIXATION REMOVAL: SHX5040

## 2020-07-01 HISTORY — DX: Other injury of unspecified body region, initial encounter: T14.8XXA

## 2020-07-01 HISTORY — DX: Schizophrenia, unspecified: F20.9

## 2020-07-01 SURGERY — REMOVAL, EXTERNAL FIXATION DEVICE, UPPER EXTREMITY
Anesthesia: General | Site: Finger | Laterality: Left

## 2020-07-01 MED ORDER — ONDANSETRON HCL 4 MG/2ML IJ SOLN
INTRAMUSCULAR | Status: DC | PRN
  Administered 2020-07-01: 4 mg via INTRAVENOUS

## 2020-07-01 MED ORDER — FENTANYL CITRATE (PF) 100 MCG/2ML IJ SOLN
INTRAMUSCULAR | Status: DC | PRN
  Administered 2020-07-01: 50 ug via INTRAVENOUS

## 2020-07-01 MED ORDER — SULFAMETHOXAZOLE-TRIMETHOPRIM 800-160 MG PO TABS: 1.0000 | ORAL_TABLET | Freq: Two times a day (BID) | ORAL | 0 refills | Status: DC

## 2020-07-01 MED ORDER — CEFAZOLIN SODIUM-DEXTROSE 2-4 GM/100ML-% IV SOLN
2.0000 g | INTRAVENOUS | Status: AC
  Administered 2020-07-01: 2 g via INTRAVENOUS

## 2020-07-01 MED ORDER — FENTANYL CITRATE (PF) 100 MCG/2ML IJ SOLN
INTRAMUSCULAR | Status: AC
  Filled 2020-07-01: qty 2

## 2020-07-01 MED ORDER — PROMETHAZINE HCL 25 MG/ML IJ SOLN: 6.2500 mg | INTRAMUSCULAR | Status: DC | PRN

## 2020-07-01 MED ORDER — AMISULPRIDE (ANTIEMETIC) 5 MG/2ML IV SOLN: 10.0000 mg | Freq: Once | INTRAVENOUS | Status: DC | PRN

## 2020-07-01 MED ORDER — PROPOFOL 10 MG/ML IV BOLUS
INTRAVENOUS | Status: DC | PRN
  Administered 2020-07-01: 150 mg via INTRAVENOUS

## 2020-07-01 MED ORDER — MIDAZOLAM HCL 2 MG/2ML IJ SOLN
INTRAMUSCULAR | Status: AC
  Filled 2020-07-01: qty 2

## 2020-07-01 MED ORDER — EPHEDRINE SULFATE-NACL 50-0.9 MG/10ML-% IV SOSY
PREFILLED_SYRINGE | INTRAVENOUS | Status: DC | PRN
  Administered 2020-07-01 (×3): 5 mg via INTRAVENOUS

## 2020-07-01 MED ORDER — 0.9 % SODIUM CHLORIDE (POUR BTL) OPTIME
TOPICAL | Status: DC | PRN
  Administered 2020-07-01: 200 mL

## 2020-07-01 MED ORDER — LIDOCAINE 2% (20 MG/ML) 5 ML SYRINGE
INTRAMUSCULAR | Status: AC
  Filled 2020-07-01: qty 5

## 2020-07-01 MED ORDER — OXYCODONE HCL 5 MG/5ML PO SOLN: 5.0000 mg | Freq: Once | ORAL | Status: DC | PRN

## 2020-07-01 MED ORDER — MIDAZOLAM HCL 5 MG/5ML IJ SOLN
INTRAMUSCULAR | Status: DC | PRN
  Administered 2020-07-01: 2 mg via INTRAVENOUS

## 2020-07-01 MED ORDER — FENTANYL CITRATE (PF) 100 MCG/2ML IJ SOLN: 25.0000 ug | INTRAMUSCULAR | Status: DC | PRN

## 2020-07-01 MED ORDER — PROPOFOL 10 MG/ML IV BOLUS
INTRAVENOUS | Status: AC
  Filled 2020-07-01: qty 20

## 2020-07-01 MED ORDER — LACTATED RINGERS IV SOLN: INTRAVENOUS | Status: DC

## 2020-07-01 MED ORDER — ACETAMINOPHEN 500 MG PO TABS
1000.0000 mg | ORAL_TABLET | Freq: Once | ORAL | Status: AC
  Administered 2020-07-01: 1000 mg via ORAL

## 2020-07-01 MED ORDER — BUPIVACAINE HCL (PF) 0.25 % IJ SOLN
INTRAMUSCULAR | Status: DC | PRN
  Administered 2020-07-01: 10 mL

## 2020-07-01 MED ORDER — IBUPROFEN 800 MG PO TABS: 800.0000 mg | ORAL_TABLET | Freq: Three times a day (TID) | ORAL | 0 refills | Status: AC | PRN

## 2020-07-01 MED ORDER — OXYCODONE HCL 5 MG PO TABS: 5.0000 mg | ORAL_TABLET | Freq: Once | ORAL | Status: DC | PRN

## 2020-07-01 MED ORDER — ACETAMINOPHEN 500 MG PO TABS
ORAL_TABLET | ORAL | Status: AC
  Filled 2020-07-01: qty 2

## 2020-07-01 MED ORDER — ONDANSETRON HCL 4 MG/2ML IJ SOLN
INTRAMUSCULAR | Status: AC
  Filled 2020-07-01: qty 2

## 2020-07-01 MED ORDER — LIDOCAINE 2% (20 MG/ML) 5 ML SYRINGE
INTRAMUSCULAR | Status: DC | PRN
  Administered 2020-07-01: 80 mg via INTRAVENOUS

## 2020-07-01 MED ORDER — BUPIVACAINE HCL (PF) 0.25 % IJ SOLN
INTRAMUSCULAR | Status: AC
  Filled 2020-07-01: qty 30

## 2020-07-01 MED ORDER — KETOROLAC TROMETHAMINE 30 MG/ML IJ SOLN
INTRAMUSCULAR | Status: DC | PRN
  Administered 2020-07-01: 20 mg via INTRAVENOUS

## 2020-07-01 MED ORDER — KETOROLAC TROMETHAMINE 30 MG/ML IJ SOLN
INTRAMUSCULAR | Status: AC
  Filled 2020-07-01: qty 1

## 2020-07-01 MED ORDER — PHENYLEPHRINE 40 MCG/ML (10ML) SYRINGE FOR IV PUSH (FOR BLOOD PRESSURE SUPPORT)
PREFILLED_SYRINGE | INTRAVENOUS | Status: DC | PRN
  Administered 2020-07-01 (×4): 80 ug via INTRAVENOUS

## 2020-07-01 MED ORDER — CEFAZOLIN SODIUM-DEXTROSE 2-4 GM/100ML-% IV SOLN
INTRAVENOUS | Status: AC
  Filled 2020-07-01: qty 100

## 2020-07-01 SURGICAL SUPPLY — 44 items
APL PRP STRL LF DISP 70% ISPRP (MISCELLANEOUS)
BLADE MINI RND TIP GREEN BEAV (BLADE) IMPLANT
BLADE SURG 15 STRL LF DISP TIS (BLADE) ×2 IMPLANT
BLADE SURG 15 STRL SS (BLADE) ×2
BNDG CMPR 9X4 STRL LF SNTH (GAUZE/BANDAGES/DRESSINGS) ×1
BNDG COHESIVE 1X5 TAN STRL LF (GAUZE/BANDAGES/DRESSINGS) ×1 IMPLANT
BNDG ELASTIC 2X5.8 VLCR STR LF (GAUZE/BANDAGES/DRESSINGS) IMPLANT
BNDG ELASTIC 3X5.8 VLCR STR LF (GAUZE/BANDAGES/DRESSINGS) IMPLANT
BNDG ESMARK 4X9 LF (GAUZE/BANDAGES/DRESSINGS) ×1 IMPLANT
BNDG GAUZE ELAST 4 BULKY (GAUZE/BANDAGES/DRESSINGS) IMPLANT
CHLORAPREP W/TINT 26 (MISCELLANEOUS) ×1 IMPLANT
CORD BIPOLAR FORCEPS 12FT (ELECTRODE) IMPLANT
COVER BACK TABLE 60X90IN (DRAPES) ×2 IMPLANT
COVER MAYO STAND STRL (DRAPES) ×2 IMPLANT
COVER WAND RF STERILE (DRAPES) IMPLANT
CUFF TOURN SGL QUICK 18X4 (TOURNIQUET CUFF) ×2 IMPLANT
DRAPE EXTREMITY T 121X128X90 (DISPOSABLE) ×2 IMPLANT
DRAPE OEC MINIVIEW 54X84 (DRAPES) ×1 IMPLANT
DRAPE SURG 17X23 STRL (DRAPES) ×2 IMPLANT
GAUZE SPONGE 4X4 12PLY STRL (GAUZE/BANDAGES/DRESSINGS) ×2 IMPLANT
GAUZE XEROFORM 1X8 LF (GAUZE/BANDAGES/DRESSINGS) ×2 IMPLANT
GLOVE BIO SURGEON STRL SZ7.5 (GLOVE) ×2 IMPLANT
GOWN STRL REUS W/ TWL LRG LVL3 (GOWN DISPOSABLE) ×1 IMPLANT
GOWN STRL REUS W/TWL LRG LVL3 (GOWN DISPOSABLE) ×2
GOWN STRL REUS W/TWL XL LVL3 (GOWN DISPOSABLE) ×2 IMPLANT
NDL HYPO 25X1 1.5 SAFETY (NEEDLE) IMPLANT
NDL SAFETY ECLIPSE 18X1.5 (NEEDLE) IMPLANT
NEEDLE HYPO 18GX1.5 SHARP (NEEDLE)
NEEDLE HYPO 25X1 1.5 SAFETY (NEEDLE) ×2 IMPLANT
NS IRRIG 1000ML POUR BTL (IV SOLUTION) ×2 IMPLANT
PACK BASIN DAY SURGERY FS (CUSTOM PROCEDURE TRAY) ×2 IMPLANT
PAD CAST 3X4 CTTN HI CHSV (CAST SUPPLIES) IMPLANT
PADDING CAST ABS 4INX4YD NS (CAST SUPPLIES)
PADDING CAST ABS COTTON 4X4 ST (CAST SUPPLIES) ×1 IMPLANT
PADDING CAST COTTON 3X4 STRL (CAST SUPPLIES)
STOCKINETTE 4X48 STRL (DRAPES) ×2 IMPLANT
SUT ETHILON 3 0 PS 1 (SUTURE) IMPLANT
SUT ETHILON 4 0 PS 2 18 (SUTURE) IMPLANT
SWAB COLLECTION DEVICE MRSA (MISCELLANEOUS) IMPLANT
SWAB CULTURE ESWAB REG 1ML (MISCELLANEOUS) IMPLANT
SYR BULB EAR ULCER 3OZ GRN STR (SYRINGE) ×2 IMPLANT
SYR CONTROL 10ML LL (SYRINGE) ×1 IMPLANT
TOWEL GREEN STERILE FF (TOWEL DISPOSABLE) ×3 IMPLANT
UNDERPAD 30X36 HEAVY ABSORB (UNDERPADS AND DIAPERS) ×2 IMPLANT

## 2020-07-01 NOTE — Anesthesia Procedure Notes (Signed)
Procedure Name: LMA Insertion Date/Time: 07/01/2020 1:12 PM Performed by: Marny Lowenstein, CRNA Pre-anesthesia Checklist: Patient identified, Emergency Drugs available, Suction available and Patient being monitored Patient Re-evaluated:Patient Re-evaluated prior to induction Oxygen Delivery Method: Circle system utilized Preoxygenation: Pre-oxygenation with 100% oxygen Induction Type: IV induction Ventilation: Mask ventilation without difficulty LMA: LMA inserted Number of attempts: 1 Placement Confirmation: positive ETCO2 and breath sounds checked- equal and bilateral Tube secured with: Tape Dental Injury: Teeth and Oropharynx as per pre-operative assessment

## 2020-07-01 NOTE — Op Note (Signed)
NAME: Nathaniel Park MEDICAL RECORD NO: [id number] DATE OF BIRTH: Jun 10, 1955 FACILITY: [facility] LOCATION: [facility] PHYSICIAN: Tami Ribas, MD   OPERATIVE REPORT   DATE OF PROCEDURE: 07/01/20    PREOPERATIVE DIAGNOSIS:   External fixator left index finger for middle phalanx intra-articular fracture   POSTOPERATIVE DIAGNOSIS:   External fixator left index finger for middle phalanx intra-articular fracture   PROCEDURE:   Removal left index finger external fixator   SURGEON:  Betha Loa, M.D.   ASSISTANT: none   ANESTHESIA:  General   INTRAVENOUS FLUIDS:  Per anesthesia flow sheet.   ESTIMATED BLOOD LOSS:  Minimal.   COMPLICATIONS:  None.   SPECIMENS:  none   TOURNIQUET TIME:   Left arm: Approximately 15 minutes at 250 mmHg   DISPOSITION:  Stable to PACU.   INDICATIONS: 65 year old male 7-1/2 weeks  status post open reduction and external fixation left index finger intra-articular middle phalanx fracture.  Presents today for removal of external fixator.  Risks, benefits and alternatives of surgery were discussed including the risks of blood loss, infection, damage to nerves, vessels, tendons, ligaments, bone for surgery, need for additional surgery, complications with wound healing, continued pain, nonunion, malunion,  stiffness.  He voiced understanding of these risks and elected to proceed.  OPERATIVE COURSE:  After being identified preoperatively by myself,  the patient and I agreed on the procedure and site of the procedure.  The surgical site was marked.  Surgical consent had been signed. He was given IV antibiotics as preoperative antibiotic prophylaxis. He was transferred to the operating room and placed on the operating table in supine position with the Left upper extremity on an arm board.  General anesthesia was induced by the anesthesiologist.  Surgical pause was performed between surgeons anesthesia and operating room staff and our agreement to patient  procedure site procedure.  The external fixator bar was removed.  Left upper extremity was prepped and draped in normal sterile orthopedic fashion.  A surgical pause was performed between the surgeons, anesthesia, and operating room staff and all were in agreement as to the patient, procedure, and site of procedure.  Tourniquet at the proximal aspect of the extremity was inflated to 250 mmHg after exsanguination of the arm with an Esmarch bandage.    The pins from the external fixator were removed with needle holders.  There is no purulence within the pin sites.  There was motion in the middle phalanx.  C-arm was used in AP and lateral projections to ensure complete removal of all hardware which was the case.  There is one small piece of suture remaining which was removed.  The scab was removed from the dorsal radial aspect of the index finger.  The skin was healed underneath with the exception of small area at the radial side of the PIP joint.  There was a small piece of exposed bone which was able to be clipped off with the rongeurs.  The pin sites were lightly curetted.  The wounds were copiously irrigated with sterile saline.  The pin sites and wound were dressed with sterile Xeroform 4 x 4 and wrapped with a Coban dressing lightly.  An AlumaFoam splint was placed and wrapped lightly with Coban dressing.  The tourniquet was deflated at approximately 15 minutes.  Fingertips were pink with brisk capillary refill after deflation of tourniquet.  The operative  drapes were broken down.  The patient was awoken from anesthesia safely.  He was transferred back to the  stretcher and taken to PACU in stable condition.  I will see him back in the office in 1 week for postoperative followup.    He states he has some hydrocodone left over from his previous surgery.  He requested a prescription for ibuprofen.  We will also give him a prescription for Bactrim DS 1 p.o. twice daily x7 days.   Betha Loa, MD Electronically  signed, 07/01/20

## 2020-07-01 NOTE — Transfer of Care (Signed)
Immediate Anesthesia Transfer of Care Note  Patient: Nathaniel Park  Procedure(s) Performed: REMOVAL EXTERNAL FIXATOR LEFT INDEX FINGER (Left Finger)  Patient Location: PACU  Anesthesia Type:General  Level of Consciousness: drowsy  Airway & Oxygen Therapy: Patient Spontanous Breathing and Patient connected to nasal cannula oxygen  Post-op Assessment: Report given to RN and Post -op Vital signs reviewed and stable  Post vital signs: Reviewed and stable  Last Vitals:  Vitals Value Taken Time  BP 126/76 07/01/20 1353  Temp    Pulse 105 07/01/20 1358  Resp 16 07/01/20 1358  SpO2 99 % 07/01/20 1358  Vitals shown include unvalidated device data.  Last Pain:  Vitals:   07/01/20 1146  TempSrc: Oral  PainSc: 0-No pain      Patients Stated Pain Goal: 3 (07/01/20 1146)  Complications: No complications documented.

## 2020-07-01 NOTE — Discharge Instructions (Addendum)
[facility] Instructions Hand Surgery  Wound Care: Keep your hand elevated above the level of your heart.  Do not allow it to dangle by your side.  Keep the dressing dry and do not remove it unless your doctor advises you to do so.  He will usually change it at the time of your post-op visit.  Moving your fingers is advised to stimulate circulation but will depend on the site of your surgery.  If you have a splint applied, your doctor will advise you regarding movement.  Activity: Do not drive or operate machinery today.  Rest today and then you may return to your normal activity and work as indicated by your physician.  Diet:  Drink liquids today or eat a light diet.  You may resume a regular diet tomorrow.    General expectations: Pain for two to three days. Fingers may become slightly swollen.  Call your doctor if any of the following occur: Severe pain not relieved by pain medication. Elevated temperature. Dressing soaked with blood. Inability to move fingers. White or bluish color to fingers.  1000 mg of tylenol given at 1200 noon on 07/01/20    Post Anesthesia Home Care Instructions  Activity: Get plenty of rest for the remainder of the day. A responsible individual must stay with you for 24 hours following the procedure.  For the next 24 hours, DO NOT: -Drive a car -Advertising copywriter -Drink alcoholic beverages -Take any medication unless instructed by your physician -Make any legal decisions or sign important papers.  Meals: Start with liquid foods such as gelatin or soup. Progress to regular foods as tolerated. Avoid greasy, spicy, heavy foods. If nausea and/or vomiting occur, drink only clear liquids until the nausea and/or vomiting subsides. Call your physician if vomiting continues.  Special Instructions/Symptoms: Your throat may feel dry or sore from the anesthesia or the breathing tube placed in your throat during surgery. If this causes discomfort, gargle with  warm salt water. The discomfort should disappear within 24 hours.  If you had a scopolamine patch placed behind your ear for the management of post- operative nausea and/or vomiting:  1. The medication in the patch is effective for 72 hours, after which it should be removed.  Wrap patch in a tissue and discard in the trash. Wash hands thoroughly with soap and water. 2. You may remove the patch earlier than 72 hours if you experience unpleasant side effects which may include dry mouth, dizziness or visual disturbances. 3. Avoid touching the patch. Wash your hands with soap and water after contact with the patch.

## 2020-07-01 NOTE — Anesthesia Postprocedure Evaluation (Signed)
Anesthesia Post Note  Patient: Nathaniel Park  Procedure(s) Performed: REMOVAL EXTERNAL FIXATOR LEFT INDEX FINGER (Left Finger)     Patient location during evaluation: PACU Anesthesia Type: General Level of consciousness: awake Pain management: pain level controlled Vital Signs Assessment: post-procedure vital signs reviewed and stable Respiratory status: spontaneous breathing and respiratory function stable Cardiovascular status: stable Postop Assessment: no apparent nausea or vomiting Anesthetic complications: no   No complications documented.  Last Vitals:  Vitals:   07/01/20 1400 07/01/20 1415  BP: 140/83 139/81  Pulse: (!) 109 86  Resp: 18 18  Temp:    SpO2: 98% 98%    Last Pain:  Vitals:   07/01/20 1415  TempSrc:   PainSc: 0-No pain                 Mellody Dance

## 2020-07-01 NOTE — Anesthesia Preprocedure Evaluation (Addendum)
Anesthesia Evaluation  Patient identified by MRN, date of birth, ID band Patient awake    Reviewed: Allergy & Precautions, NPO status , Patient's Chart, lab work & pertinent test results  History of Anesthesia Complications Negative for: history of anesthetic complications  Airway Mallampati: II  TM Distance: >3 FB Neck ROM: Full    Dental  (+) Dental Advisory Given, Partial Upper   Pulmonary Current Smoker and Patient abstained from smoking.,    Pulmonary exam normal        Cardiovascular negative cardio ROS Normal cardiovascular exam     Neuro/Psych PSYCHIATRIC DISORDERS Depression Schizophrenia negative neurological ROS     GI/Hepatic negative GI ROS, Neg liver ROS,   Endo/Other  negative endocrine ROS  Renal/GU negative Renal ROS     Musculoskeletal negative musculoskeletal ROS (+)   Abdominal Normal abdominal exam  (+)   Peds  Hematology negative hematology ROS (+)   Anesthesia Other Findings Crush injury of finger   Reproductive/Obstetrics                            Anesthesia Physical  Anesthesia Plan  ASA: II  Anesthesia Plan: General   Post-op Pain Management:    Induction: Intravenous  PONV Risk Score and Plan: 2 and Treatment may vary due to age or medical condition, Ondansetron and Midazolam  Airway Management Planned: LMA  Additional Equipment: None  Intra-op Plan:   Post-operative Plan: Extubation in OR  Informed Consent: I have reviewed the patients History and Physical, chart, labs and discussed the procedure including the risks, benefits and alternatives for the proposed anesthesia with the patient or authorized representative who has indicated his/her understanding and acceptance.     Dental advisory given  Plan Discussed with: CRNA and Anesthesiologist  Anesthesia Plan Comments:        Anesthesia Quick Evaluation

## 2020-07-01 NOTE — H&P (Signed)
  Nathaniel Park is an 65 y.o. male.   Chief Complaint: ex fix HPI: 65 yo male 8 weeks s/p open reduction with external fixation left index finger.  Presents today for removal of ex fix.  Allergies: No Known Allergies  Past Medical History:  Diagnosis Date  . Crush injury    left index finger  . Depression   . Schizophrenia [facility])     Past Surgical History:  Procedure Laterality Date  . EXTERNAL FIXATION OF FINGER Left 05/09/2020   Procedure: EXTERNAL FIXATION OF FINGER;  Surgeon: Betha Loa, MD;  Location: [facility] OR;  Service: Orthopedics;  Laterality: Left;  . I & D EXTREMITY Left 05/09/2020   Procedure: IRRIGATION AND DEBRIDEMENT left index finger;  Surgeon: Betha Loa, MD;  Location: [facility] OR;  Service: Orthopedics;  Laterality: Left;  . INCISE AND DRAIN ABCESS      Family History: History reviewed. No pertinent family history.  Social History:   reports that he has been smoking cigarettes. He has been smoking about 1.00 pack per day. He has never used smokeless tobacco. He reports previous alcohol use. He reports previous drug use.  Medications: Medications Prior to Admission  Medication Sig Dispense Refill  . ibuprofen (ADVIL) 200 MG tablet Take 400 mg by mouth every 6 (six) hours as needed.    Marland Kitchen perphenazine (TRILAFON) 16 MG tablet Take 1 tablet (16 mg total) by mouth at bedtime. For mood control 30 tablet 0    No results found for this or any previous visit (from the past 48 hour(s)).  No results found.   A comprehensive review of systems was negative.  Blood pressure 138/79, pulse (!) 58, temperature (!) 97.5 F (36.4 C), temperature source Oral, resp. rate 16, height 6' (1.829 m), weight 79.5 kg, SpO2 100 %.  General appearance: alert, cooperative and appears stated age Head: Normocephalic, without obvious abnormality, atraumatic Neck: supple, symmetrical, trachea midline Cardio: regular rate and rhythm Resp: clear to auscultation bilaterally Extremities: Intact  sensation and capillary refill all digits.  +epl/fpl/io.  No wounds.  Pulses: 2+ and symmetric Skin: Skin color, texture, turgor normal. No rashes or lesions Neurologic: Grossly normal Incision/Wound: none  Assessment/Plan Left index finger external fixation.  Plan removal ex fix.  Risks, benefits, and alternatives of surgery have been discussed and the patient agrees with the plan of care.   Betha Loa 07/01/2020, 12:14 PM

## 2020-07-02 ENCOUNTER — Encounter (HOSPITAL_BASED_OUTPATIENT_CLINIC_OR_DEPARTMENT_OTHER): Payer: Self-pay | Admitting: Orthopedic Surgery

## 2020-07-25 ENCOUNTER — Other Ambulatory Visit: Payer: Self-pay | Admitting: Orthopedic Surgery

## 2020-08-06 ENCOUNTER — Encounter (HOSPITAL_BASED_OUTPATIENT_CLINIC_OR_DEPARTMENT_OTHER): Payer: Self-pay | Admitting: Orthopedic Surgery

## 2020-08-06 ENCOUNTER — Other Ambulatory Visit (HOSPITAL_COMMUNITY): Payer: Self-pay

## 2020-08-06 ENCOUNTER — Other Ambulatory Visit: Payer: Self-pay

## 2020-08-07 ENCOUNTER — Other Ambulatory Visit (HOSPITAL_COMMUNITY)
Admission: RE | Admit: 2020-08-07 | Discharge: 2020-08-07 | Disposition: A | Discharge status: Home / Self Care | Payer: Self-pay | Source: Ambulatory Visit | Attending: Orthopedic Surgery | Admitting: Orthopedic Surgery

## 2020-08-07 DIAGNOSIS — Z20822 Contact with and (suspected) exposure to covid-19: Secondary | ICD-10-CM | POA: Insufficient documentation

## 2020-08-07 DIAGNOSIS — Z01812 Encounter for preprocedural laboratory examination: Secondary | ICD-10-CM | POA: Insufficient documentation

## 2020-08-07 LAB — SARS CORONAVIRUS 2 (TAT 6-24 HRS): SARS Coronavirus 2: NEGATIVE

## 2020-08-09 ENCOUNTER — Encounter (HOSPITAL_BASED_OUTPATIENT_CLINIC_OR_DEPARTMENT_OTHER)
Admission: RE | Disposition: A | Discharge status: Home / Self Care | Payer: Self-pay | Source: Home / Self Care | Attending: Orthopedic Surgery

## 2020-08-09 ENCOUNTER — Ambulatory Visit (HOSPITAL_BASED_OUTPATIENT_CLINIC_OR_DEPARTMENT_OTHER): Payer: PRIVATE HEALTH INSURANCE | Admitting: Anesthesiology

## 2020-08-09 ENCOUNTER — Ambulatory Visit (HOSPITAL_BASED_OUTPATIENT_CLINIC_OR_DEPARTMENT_OTHER)
Admission: RE | Admit: 2020-08-09 | Discharge: 2020-08-09 | Disposition: A | Discharge status: Home / Self Care | Payer: PRIVATE HEALTH INSURANCE | Attending: Orthopedic Surgery | Admitting: Orthopedic Surgery

## 2020-08-09 ENCOUNTER — Encounter (HOSPITAL_BASED_OUTPATIENT_CLINIC_OR_DEPARTMENT_OTHER): Payer: Self-pay | Admitting: Orthopedic Surgery

## 2020-08-09 ENCOUNTER — Other Ambulatory Visit: Payer: Self-pay

## 2020-08-09 DIAGNOSIS — F17210 Nicotine dependence, cigarettes, uncomplicated: Secondary | ICD-10-CM | POA: Insufficient documentation

## 2020-08-09 DIAGNOSIS — Z79899 Other long term (current) drug therapy: Secondary | ICD-10-CM | POA: Insufficient documentation

## 2020-08-09 DIAGNOSIS — S62621K Displaced fracture of medial phalanx of left index finger, subsequent encounter for fracture with nonunion: Secondary | ICD-10-CM | POA: Diagnosis present

## 2020-08-09 DIAGNOSIS — X58XXXD Exposure to other specified factors, subsequent encounter: Secondary | ICD-10-CM | POA: Diagnosis not present

## 2020-08-09 HISTORY — PX: AMPUTATION: SHX166

## 2020-08-09 SURGERY — AMPUTATION DIGIT
Anesthesia: Monitor Anesthesia Care | Site: Hand | Laterality: Left

## 2020-08-09 MED ORDER — CEFAZOLIN SODIUM-DEXTROSE 2-4 GM/100ML-% IV SOLN
2.0000 g | INTRAVENOUS | Status: AC
  Administered 2020-08-09: 2 g via INTRAVENOUS

## 2020-08-09 MED ORDER — LACTATED RINGERS IV SOLN: INTRAVENOUS | Status: DC

## 2020-08-09 MED ORDER — FENTANYL CITRATE (PF) 100 MCG/2ML IJ SOLN
INTRAMUSCULAR | Status: AC
  Filled 2020-08-09: qty 2

## 2020-08-09 MED ORDER — PROPOFOL 500 MG/50ML IV EMUL
INTRAVENOUS | Status: DC | PRN
  Administered 2020-08-09: 25 ug/kg/min via INTRAVENOUS
  Administered 2020-08-09: 200 ug/kg/min via INTRAVENOUS

## 2020-08-09 MED ORDER — MIDAZOLAM HCL 2 MG/2ML IJ SOLN
INTRAMUSCULAR | Status: AC
  Filled 2020-08-09: qty 2

## 2020-08-09 MED ORDER — FENTANYL CITRATE (PF) 100 MCG/2ML IJ SOLN
50.0000 ug | Freq: Once | INTRAMUSCULAR | Status: AC
  Administered 2020-08-09: 50 ug via INTRAVENOUS

## 2020-08-09 MED ORDER — HYDROCODONE-ACETAMINOPHEN 5-325 MG PO TABS: ORAL_TABLET | ORAL | 0 refills | Status: AC

## 2020-08-09 MED ORDER — CELECOXIB 200 MG PO CAPS: 200.0000 mg | ORAL_CAPSULE | Freq: Once | ORAL | Status: DC

## 2020-08-09 MED ORDER — BUPIVACAINE-EPINEPHRINE (PF) 0.5% -1:200000 IJ SOLN
INTRAMUSCULAR | Status: DC | PRN
  Administered 2020-08-09: 30 mL via PERINEURAL

## 2020-08-09 MED ORDER — ACETAMINOPHEN 500 MG PO TABS
ORAL_TABLET | ORAL | Status: AC
  Filled 2020-08-09: qty 1

## 2020-08-09 MED ORDER — HYDROMORPHONE HCL 1 MG/ML IJ SOLN: 0.2500 mg | INTRAMUSCULAR | Status: DC | PRN

## 2020-08-09 MED ORDER — MIDAZOLAM HCL 2 MG/2ML IJ SOLN
1.0000 mg | Freq: Once | INTRAMUSCULAR | Status: AC
  Administered 2020-08-09: 1 mg via INTRAVENOUS

## 2020-08-09 MED ORDER — LIDOCAINE 2% (20 MG/ML) 5 ML SYRINGE
INTRAMUSCULAR | Status: DC | PRN
  Administered 2020-08-09: 40 mg via INTRAVENOUS

## 2020-08-09 MED ORDER — ACETAMINOPHEN 500 MG PO TABS
1000.0000 mg | ORAL_TABLET | Freq: Once | ORAL | Status: AC
  Administered 2020-08-09: 1000 mg via ORAL

## 2020-08-09 MED ORDER — CEFAZOLIN SODIUM-DEXTROSE 2-4 GM/100ML-% IV SOLN
INTRAVENOUS | Status: AC
  Filled 2020-08-09: qty 100

## 2020-08-09 MED ORDER — MIDAZOLAM HCL 5 MG/5ML IJ SOLN
INTRAMUSCULAR | Status: DC | PRN
  Administered 2020-08-09: 1 mg via INTRAVENOUS

## 2020-08-09 MED ORDER — ONDANSETRON HCL 4 MG/2ML IJ SOLN
INTRAMUSCULAR | Status: DC | PRN
  Administered 2020-08-09: 4 mg via INTRAVENOUS

## 2020-08-09 SURGICAL SUPPLY — 48 items
APL PRP STRL LF DISP 70% ISPRP (MISCELLANEOUS)
BLADE MINI RND TIP GREEN BEAV (BLADE) IMPLANT
BLADE SURG 15 STRL LF DISP TIS (BLADE) ×2 IMPLANT
BLADE SURG 15 STRL SS (BLADE) ×4
BNDG CMPR 9X4 STRL LF SNTH (GAUZE/BANDAGES/DRESSINGS) ×1
BNDG COHESIVE 1X5 TAN STRL LF (GAUZE/BANDAGES/DRESSINGS) ×1 IMPLANT
BNDG ELASTIC 2X5.8 VLCR STR LF (GAUZE/BANDAGES/DRESSINGS) IMPLANT
BNDG ELASTIC 3X5.8 VLCR STR LF (GAUZE/BANDAGES/DRESSINGS) IMPLANT
BNDG ESMARK 4X9 LF (GAUZE/BANDAGES/DRESSINGS) ×1 IMPLANT
BNDG GAUZE 1X2.1 STRL (MISCELLANEOUS) ×1 IMPLANT
CHLORAPREP W/TINT 26 (MISCELLANEOUS) IMPLANT
CORD BIPOLAR FORCEPS 12FT (ELECTRODE) ×2 IMPLANT
COVER BACK TABLE 60X90IN (DRAPES) ×2 IMPLANT
COVER MAYO STAND STRL (DRAPES) ×2 IMPLANT
COVER WAND RF STERILE (DRAPES) IMPLANT
DECANTER SPIKE VIAL GLASS SM (MISCELLANEOUS) IMPLANT
DRAIN PENROSE 1/4X12 LTX STRL (WOUND CARE) IMPLANT
DRAPE EXTREMITY T 121X128X90 (DISPOSABLE) ×2 IMPLANT
DRAPE OEC MINIVIEW 54X84 (DRAPES) IMPLANT
DRAPE SURG 17X23 STRL (DRAPES) ×2 IMPLANT
GAUZE SPONGE 4X4 12PLY STRL (GAUZE/BANDAGES/DRESSINGS) ×2 IMPLANT
GAUZE SPONGE 4X4 12PLY STRL LF (GAUZE/BANDAGES/DRESSINGS) IMPLANT
GAUZE XEROFORM 1X8 LF (GAUZE/BANDAGES/DRESSINGS) ×2 IMPLANT
GLOVE SRG 8 PF TXTR STRL LF DI (GLOVE) ×1 IMPLANT
GLOVE SURG ENC MOIS LTX SZ7.5 (GLOVE) ×2 IMPLANT
GLOVE SURG UNDER POLY LF SZ8 (GLOVE) ×2
GOWN STRL REUS W/ TWL LRG LVL3 (GOWN DISPOSABLE) ×1 IMPLANT
GOWN STRL REUS W/TWL LRG LVL3 (GOWN DISPOSABLE) ×4
GOWN STRL REUS W/TWL XL LVL3 (GOWN DISPOSABLE) ×2 IMPLANT
NDL HYPO 25X1 1.5 SAFETY (NEEDLE) IMPLANT
NEEDLE HYPO 25X1 1.5 SAFETY (NEEDLE) IMPLANT
NS IRRIG 1000ML POUR BTL (IV SOLUTION) ×2 IMPLANT
PACK BASIN DAY SURGERY FS (CUSTOM PROCEDURE TRAY) ×2 IMPLANT
PADDING CAST ABS 4INX4YD NS (CAST SUPPLIES) ×1
PADDING CAST ABS COTTON 4X4 ST (CAST SUPPLIES) ×1 IMPLANT
STOCKINETTE 4X48 STRL (DRAPES) ×2 IMPLANT
SUT CHROMIC 4 0 P 3 18 (SUTURE) IMPLANT
SUT ETHILON 3 0 PS 1 (SUTURE) IMPLANT
SUT ETHILON 4 0 PS 2 18 (SUTURE) IMPLANT
SUT MERSILENE 4 0 P 3 (SUTURE) IMPLANT
SUT MON AB 5-0 P3 18 (SUTURE) ×1 IMPLANT
SUT VIC AB 4-0 P-3 18XBRD (SUTURE) IMPLANT
SUT VIC AB 4-0 P3 18 (SUTURE)
SYR BULB EAR ULCER 3OZ GRN STR (SYRINGE) ×2 IMPLANT
SYR CONTROL 10ML LL (SYRINGE) IMPLANT
TOWEL GREEN STERILE FF (TOWEL DISPOSABLE) ×2 IMPLANT
TRAY DSU PREP LF (CUSTOM PROCEDURE TRAY) ×2 IMPLANT
UNDERPAD 30X36 HEAVY ABSORB (UNDERPADS AND DIAPERS) ×2 IMPLANT

## 2020-08-09 NOTE — Anesthesia Preprocedure Evaluation (Addendum)
Anesthesia Evaluation  Patient identified by MRN, date of birth, ID band Patient awake    Reviewed: Allergy & Precautions, H&P , NPO status , Patient's Chart, lab work & pertinent test results  Airway Mallampati: II  TM Distance: >3 FB Neck ROM: Full    Dental no notable dental hx. (+) Poor Dentition, Dental Advisory Given   Pulmonary Current Smoker and Patient abstained from smoking.,    Pulmonary exam normal breath sounds clear to auscultation       Cardiovascular negative cardio ROS   Rhythm:Regular Rate:Normal     Neuro/Psych Depression Schizophrenia negative neurological ROS     GI/Hepatic negative GI ROS, Neg liver ROS,   Endo/Other  negative endocrine ROS  Renal/GU negative Renal ROS  negative genitourinary   Musculoskeletal   Abdominal   Peds  Hematology negative hematology ROS (+)   Anesthesia Other Findings   Reproductive/Obstetrics negative OB ROS                            Anesthesia Physical Anesthesia Plan  ASA: II  Anesthesia Plan: Regional and MAC   Post-op Pain Management:    Induction: Intravenous  PONV Risk Score and Plan: 1 and Midazolam, Ondansetron and Propofol infusion  Airway Management Planned: Simple Face Mask  Additional Equipment:   Intra-op Plan:   Post-operative Plan:   Informed Consent: I have reviewed the patients History and Physical, chart, labs and discussed the procedure including the risks, benefits and alternatives for the proposed anesthesia with the patient or authorized representative who has indicated his/her understanding and acceptance.     Dental advisory given  Plan Discussed with: CRNA  Anesthesia Plan Comments:         Anesthesia Quick Evaluation

## 2020-08-09 NOTE — Progress Notes (Signed)
Assisted Dr. Edmond Fitzgerald with left, ultrasound guided, supraclavicular block. Side rails up, monitors on throughout procedure. See vital signs in flow sheet. Tolerated Procedure well. 

## 2020-08-09 NOTE — H&P (Signed)
  Nathaniel Park is an 65 y.o. male.   Chief Complaint: nonunion HPI: 65 yo male s/p ex fix for crush injury left index finger.  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  He wishes to proceed with amputation of the left index finger.  Allergies: No Known Allergies  Past Medical History:  Diagnosis Date  . Crush injury    left index finger  . Depression   . Schizophrenia [facility])     Past Surgical History:  Procedure Laterality Date  . EXTERNAL FIXATION OF FINGER Left 05/09/2020   Procedure: EXTERNAL FIXATION OF FINGER;  Surgeon: Betha Loa, MD;  Location: [facility] OR;  Service: Orthopedics;  Laterality: Left;  . EXTERNAL FIXATION REMOVAL Left 07/01/2020   Procedure: REMOVAL EXTERNAL FIXATOR LEFT INDEX FINGER;  Surgeon: Betha Loa, MD;  Location: [facility];  Service: Orthopedics;  Laterality: Left;  REMOVAL EXTERNAL FIXATOR LEFT INDEX FINGER 30 MINUTES  . I & D EXTREMITY Left 05/09/2020   Procedure: IRRIGATION AND DEBRIDEMENT left index finger;  Surgeon: Betha Loa, MD;  Location: [facility] OR;  Service: Orthopedics;  Laterality: Left;  . INCISE AND DRAIN ABCESS      Family History: History reviewed. No pertinent family history.  Social History:   reports that he has been smoking cigarettes. He has been smoking about 1.00 pack per day. He has never used smokeless tobacco. He reports previous alcohol use. He reports previous drug use.  Medications: Medications Prior to Admission  Medication Sig Dispense Refill  . ibuprofen (ADVIL) 200 MG tablet Take 400 mg by mouth every 6 (six) hours as needed.    Marland Kitchen ibuprofen (ADVIL) 800 MG tablet Take 1 tablet (800 mg total) by mouth every 8 (eight) hours as needed. 30 tablet 0  . perphenazine (TRILAFON) 16 MG tablet Take 1 tablet (16 mg total) by mouth at bedtime. For mood control 30 tablet 0    No results found for this or any previous visit (from the past 48 hour(s)).  No results found.   A comprehensive review of systems was  negative.  Blood pressure (!) 157/90, pulse 70, temperature 97.6 F (36.4 C), temperature source Oral, resp. rate 12, height 6' (1.829 m), weight 79.9 kg, SpO2 100 %.  General appearance: alert, cooperative and appears stated age Head: Normocephalic, without obvious abnormality, atraumatic Neck: supple, symmetrical, trachea midline Cardio: regular rate and rhythm Resp: clear to auscultation bilaterally Extremities: Intact sensation and capillary refill all digits.  +epl/fpl/io.  No wounds.  Pulses: 2+ and symmetric Skin: Skin color, texture, turgor normal. No rashes or lesions Neurologic: Grossly normal Incision/Wound: none  Assessment/Plan Left index finger painful nonunion.  Plan amputation left index finger.  Non operative and operative treatment options have been discussed with the patient and patient wishes to proceed with operative treatment. Risks, benefits, and alternatives of surgery have been discussed and the patient agrees with the plan of care.   Betha Loa 08/09/2020, 11:33 AM

## 2020-08-09 NOTE — Op Note (Signed)
NAME: Nathaniel Park MEDICAL RECORD NO: [id number] DATE OF BIRTH: Oct 23, 1955 FACILITY: [facility] LOCATION: [facility] PHYSICIAN: Tami Ribas, MD   OPERATIVE REPORT   DATE OF PROCEDURE: 08/09/20    PREOPERATIVE DIAGNOSIS:   Left index finger painful nonunion   POSTOPERATIVE DIAGNOSIS:   Left index finger painful nonunion   PROCEDURE:   Left index finger amputation through PIP joint   SURGEON:  Betha Loa, M.D.   ASSISTANT: none   ANESTHESIA:  Regional with sedation   INTRAVENOUS FLUIDS:  Per anesthesia flow sheet.   ESTIMATED BLOOD LOSS:  Minimal.   COMPLICATIONS:  None.   SPECIMENS:   Left index finger to pathology for gross exam   TOURNIQUET TIME:    Total Tourniquet Time Documented: Upper Arm (Left) - 27 minutes Total: Upper Arm (Left) - 27 minutes    DISPOSITION:  Stable to PACU.   INDICATIONS: 65 year old male status post crush injury of left index finger treated with external fixation.  He has a painful nonunion of the middle phalanx with bone loss and insensate radial side of the finger.  He wishes to proceed with amputation of the finger through the PIP joint. Risks, benefits and alternatives of surgery were discussed including the risks of blood loss, infection, damage to nerves, vessels, tendons, ligaments, bone for surgery, need for additional surgery, complications with wound healing, continued pain, stiffness, need for further amputation.  He voiced understanding of these risks and elected to proceed.  OPERATIVE COURSE:  After being identified preoperatively by myself,  the patient and I agreed on the procedure and site of the procedure.  The surgical site was marked.  Surgical consent had been signed. He was given IV antibiotics as preoperative antibiotic prophylaxis. He was transferred to the operating room and placed on the operating table in supine position with the Left upper extremity on an arm board.  Sedation was induced by the  anesthesiologist. A regional block had been performed by anesthesia in preoperative holding.   Left upper extremity was prepped and draped in normal sterile orthopedic fashion.  A surgical pause was performed between the surgeons, anesthesia, and operating room staff and all were in agreement as to the patient, procedure, and site of procedure.  Tourniquet at the proximal aspect of the extremity was inflated to 250 mmHg after exsanguination of the arm with an Esmarch bandage.    Fishmouth type incision was made for amputation through the PIP joint.  This is carried into subcutaneous tissues by spreading technique.  Bipolar electrocautery is used to obtain hemostasis.  The radial and ulnar neurovascular bundles were identified.  The digital nerves were placed under traction bipolar and allowed to retract.  The digital arteries were treated with bipolar electrocautery.  The finger was amputated through the PIP joint.  There were bony fragments remaining from the middle phalanx.  These were carefully removed.  The area was palpated to ensure removal of all fracture fragments which was the case.  The wound was copiously irrigated with sterile saline.  The condyles of the proximal phalanx were rounded off to provide better contour.  The wound was closed with 5-0 Monocryl in a interrupted and vertical mattress fashion.  Good tension-free reapproximation of soft tissues was obtained with good soft tissue coverage.  The wound was dressed with sterile Xeroform 4 x 4 and wrapped with a Coban dressing.  An AlumaFoam splint was placed and wrapped lightly with Coban dressing.  The tourniquet was deflated at 27 minutes.  Fingertips were pink with brisk capillary refill after deflation of tourniquet.  The operative  drapes were broken down.  The patient was awoken from anesthesia safely.  He was transferred back to the stretcher and taken to PACU in stable condition.  I will see him back in the office in 1 week for postoperative  followup.  I will give him a prescription for Norco 5/325 1-2 tabs PO q6 hours prn pain, dispense # 20.   Betha Loa, MD Electronically signed, 08/09/20

## 2020-08-09 NOTE — Anesthesia Procedure Notes (Signed)
Anesthesia Regional Block: Supraclavicular block   Pre-Anesthetic Checklist: ,, timeout performed, Correct Patient, Correct Site, Correct Laterality, Correct Procedure, Correct Position, site marked, Risks and benefits discussed, pre-op evaluation,  At surgeon's request and post-op pain management  Laterality: Left  Prep: Maximum Sterile Barrier Precautions used, chloraprep       Needles:  Injection technique: Single-shot  Needle Type: Echogenic Stimulator Needle     Needle Length: 5cm  Needle Gauge: 22     Additional Needles:   Procedures:,,,, ultrasound used (permanent image in chart),,,,  Narrative:  Start time: 08/09/2020 10:20 AM End time: 08/09/2020 10:30 AM Injection made incrementally with aspirations every 5 mL. Anesthesiologist: Gaynelle Adu, MD  Additional Notes: 2% Lidocaine skin wheel.

## 2020-08-09 NOTE — Discharge Instructions (Addendum)
[facility] Instructions Hand Surgery  Wound Care: Keep your hand elevated above the level of your heart.  Do not allow it to dangle by your side.  Keep the dressing dry and do not remove it unless your doctor advises you to do so.  He will usually change it at the time of your post-op visit.  Moving your fingers is advised to stimulate circulation but will depend on the site of your surgery.  If you have a splint applied, your doctor will advise you regarding movement.  Activity: Do not drive or operate machinery today.  Rest today and then you may return to your normal activity and work as indicated by your physician.  Diet:  Drink liquids today or eat a light diet.  You may resume a regular diet tomorrow.    General expectations: Pain for two to three days. Fingers may become slightly swollen.  Call your doctor if any of the following occur: Severe pain not relieved by pain medication. Elevated temperature. Dressing soaked with blood. Inability to move fingers. White or bluish color to fingers.  May have Tylenol after 4:30pm today, if needed.  Post Anesthesia Home Care Instructions  Activity: Get plenty of rest for the remainder of the day. A responsible individual must stay with you for 24 hours following the procedure.  For the next 24 hours, DO NOT: -Drive a car -Advertising copywriter -Drink alcoholic beverages -Take any medication unless instructed by your physician -Make any legal decisions or sign important papers.  Meals: Start with liquid foods such as gelatin or soup. Progress to regular foods as tolerated. Avoid greasy, spicy, heavy foods. If nausea and/or vomiting occur, drink only clear liquids until the nausea and/or vomiting subsides. Call your physician if vomiting continues.  Special Instructions/Symptoms: Your throat may feel dry or sore from the anesthesia or the breathing tube placed in your throat during surgery. If this causes discomfort, gargle with warm  salt water. The discomfort should disappear within 24 hours.  If you had a scopolamine patch placed behind your ear for the management of post- operative nausea and/or vomiting:  1. The medication in the patch is effective for 72 hours, after which it should be removed.  Wrap patch in a tissue and discard in the trash. Wash hands thoroughly with soap and water. 2. You may remove the patch earlier than 72 hours if you experience unpleasant side effects which may include dry mouth, dizziness or visual disturbances. 3. Avoid touching the patch. Wash your hands with soap and water after contact with the patch.    Regional Anesthesia Blocks  1. Numbness or the inability to move the "blocked" extremity may last from 3-48 hours after placement. The length of time depends on the medication injected and your individual response to the medication. If the numbness is not going away after 48 hours, call your surgeon.  2. The extremity that is blocked will need to be protected until the numbness is gone and the  Strength has returned. Because you cannot feel it, you will need to take extra care to avoid injury. Because it may be weak, you may have difficulty moving it or using it. You may not know what position it is in without looking at it while the block is in effect.  3. For blocks in the legs and feet, returning to weight bearing and walking needs to be done carefully. You will need to wait until the numbness is entirely gone and the strength has returned.  You should be able to move your leg and foot normally before you try and bear weight or walk. You will need someone to be with you when you first try to ensure you do not fall and possibly risk injury.  4. Bruising and tenderness at the needle site are common side effects and will resolve in a few days.  5. Persistent numbness or new problems with movement should be communicated to the surgeon or the [place] [phone] [place] [phone]).

## 2020-08-09 NOTE — Transfer of Care (Signed)
Immediate Anesthesia Transfer of Care Note  Patient: Nathaniel Park  Procedure(s) Performed: AMPUTATION LEFT INDEX FINGER (Left Hand)  Patient Location: PACU  Anesthesia Type:MAC and Regional  Level of Consciousness: drowsy and patient cooperative  Airway & Oxygen Therapy: Patient Spontanous Breathing and Patient connected to face mask oxygen  Post-op Assessment: Report given to RN and Post -op Vital signs reviewed and stable  Post vital signs: Reviewed and stable  Last Vitals:  Vitals Value Taken Time  BP    Temp    Pulse    Resp    SpO2      Last Pain:  Vitals:   08/09/20 0955  TempSrc: Oral  PainSc: 0-No pain         Complications: No complications documented.

## 2020-08-09 NOTE — Anesthesia Postprocedure Evaluation (Signed)
Anesthesia Post Note  Patient: Nathaniel Park  Procedure(s) Performed: AMPUTATION LEFT INDEX FINGER (Left Hand)     Patient location during evaluation: PACU Anesthesia Type: Regional and MAC Level of consciousness: awake and alert Pain management: pain level controlled Vital Signs Assessment: post-procedure vital signs reviewed and stable Respiratory status: spontaneous breathing, nonlabored ventilation and respiratory function stable Cardiovascular status: stable and blood pressure returned to baseline Postop Assessment: no apparent nausea or vomiting Anesthetic complications: no   No complications documented.  Last Vitals:  Vitals:   08/09/20 1300 08/09/20 1308  BP: 140/85 133/90  Pulse: 71 64  Resp: 18 14  Temp:    SpO2: 100% 98%    Last Pain:  Vitals:   08/09/20 0955  TempSrc: Oral  PainSc: 0-No pain                 Ciearra Rufo,W. EDMOND

## 2020-08-12 ENCOUNTER — Encounter (HOSPITAL_BASED_OUTPATIENT_CLINIC_OR_DEPARTMENT_OTHER): Payer: Self-pay | Admitting: Orthopedic Surgery

## 2020-08-13 LAB — SURGICAL PATHOLOGY

## 2021-05-14 ENCOUNTER — Other Ambulatory Visit: Payer: Self-pay

## 2021-05-14 ENCOUNTER — Ambulatory Visit
Admission: EM | Admit: 2021-05-14 | Discharge: 2021-05-14 | Disposition: A | Discharge status: Home / Self Care | Payer: 59 | Attending: Family Medicine | Admitting: Family Medicine

## 2021-05-14 DIAGNOSIS — R109 Unspecified abdominal pain: Secondary | ICD-10-CM | POA: Diagnosis not present

## 2021-05-14 DIAGNOSIS — R197 Diarrhea, unspecified: Secondary | ICD-10-CM

## 2021-05-14 MED ORDER — ONDANSETRON 4 MG PO TBDP: 4.0000 mg | ORAL_TABLET | Freq: Three times a day (TID) | ORAL | 0 refills | Status: AC | PRN

## 2021-05-14 NOTE — ED Provider Notes (Signed)
[facility]   [id number] 05/14/21 Arrival Time: 8366  ASSESSMENT & PLAN:  1. Abdominal cramping   2. Diarrhea, unspecified type    Benign abdomen.    Discharge Instructions      Please do your best to ensure adequate fluid intake in order to avoid dehydration. If you find that you are unable to tolerate drinking fluids regularly please proceed to the Emergency Department for evaluation.     To use if needed: Meds ordered this encounter  Medications   ondansetron (ZOFRAN-ODT) 4 MG disintegrating tablet    Sig: Take 1 tablet (4 mg total) by mouth every 8 (eight) hours as needed for nausea or vomiting.    Dispense:  15 tablet    Refill:  0   May f/u here as needed.  Reviewed expectations re: course of current medical issues. Questions answered. Outlined signs and symptoms indicating need for more acute intervention. Patient verbalized understanding. After Visit Summary given.   SUBJECTIVE: History from: patient. Nathaniel Park is a 65 y.o. male who reports abd cramping with non-bloody diarrhea this am. Some improvement since onset. Without urinary symptoms or back pain. Afebrile. Initial mild nausea without emesis. None currently. Ambulatory. No sick contacts known. No tx PTA.  Past Surgical History:  Procedure Laterality Date   AMPUTATION Left 08/09/2020   Procedure: AMPUTATION LEFT INDEX FINGER;  Surgeon: Betha Loa, MD;  Location: [facility];  Service: Orthopedics;  Laterality: Left;  60 MINUTES   EXTERNAL FIXATION OF FINGER Left 05/09/2020   Procedure: EXTERNAL FIXATION OF FINGER;  Surgeon: Betha Loa, MD;  Location: [facility] OR;  Service: Orthopedics;  Laterality: Left;   EXTERNAL FIXATION REMOVAL Left 07/01/2020   Procedure: REMOVAL EXTERNAL FIXATOR LEFT INDEX FINGER;  Surgeon: Betha Loa, MD;  Location: [facility];  Service: Orthopedics;  Laterality: Left;  REMOVAL EXTERNAL FIXATOR LEFT INDEX FINGER 30 MINUTES   I & D  EXTREMITY Left 05/09/2020   Procedure: IRRIGATION AND DEBRIDEMENT left index finger;  Surgeon: Betha Loa, MD;  Location: [facility] OR;  Service: Orthopedics;  Laterality: Left;   INCISE AND DRAIN ABCESS     OBJECTIVE:  Vitals:   05/14/21 1051  BP: 126/80  Pulse: 73  Resp: 18  Temp: 98.1 F (36.7 C)  SpO2: 96%    General appearance: alert; no distress Oropharynx: moist Lungs: clear to auscultation bilaterally; unlabored Heart: regular Abdomen: soft; non-distended; no significant abdominal tenderness; no guarding or rebound tenderness Back: no CVA tenderness Extremities: no edema; symmetrical with no gross deformities Skin: warm; dry Neurologic: normal gait Psychological: alert and cooperative; normal mood and affect   No Known Allergies                                             Past Medical History:  Diagnosis Date   Crush injury    left index finger   Depression    Schizophrenia (HCC)    Social History   Socioeconomic History   Marital status: Married    Spouse name: Not on file   Number of children: Not on file   Years of education: high school diploma   Highest education level: 12th grade  Occupational History   Occupation: currently unemployed  Tobacco Use   Smoking status: Every Day    Packs/day: 1.00    Types: Cigarettes   Smokeless tobacco: Never  Vaping Use   Vaping Use: Never used  Substance and Sexual Activity   Alcohol use: Not Currently   Drug use: Not Currently   Sexual activity: Yes    Birth control/protection: None  Other Topics Concern   Not on file  Social History Narrative   Not on file   Social Determinants of Health   Financial Resource Strain: Not on file  Food Insecurity: Not on file  Transportation Needs: Not on file  Physical Activity: Not on file  Stress: Not on file  Social Connections: Not on file  Intimate Partner Violence: Not on file   History reviewed. No pertinent family history.    Mardella Layman, MD 05/14/21  1233

## 2021-05-14 NOTE — ED Triage Notes (Signed)
Pt presents with c/o abdominal cramping and diarrhea that began last night

## 2021-05-14 NOTE — Discharge Instructions (Signed)
Please do your best to ensure adequate fluid intake in order to avoid dehydration. If you find that you are unable to tolerate drinking fluids regularly please proceed to the Emergency Department for evaluation. ° ° °
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
## 2022-07-05 ENCOUNTER — Ambulatory Visit
Admission: EM | Admit: 2022-07-05 | Discharge: 2022-07-05 | Disposition: A | Discharge status: Home / Self Care | Payer: 59 | Attending: Nurse Practitioner | Admitting: Nurse Practitioner

## 2022-07-05 ENCOUNTER — Encounter (HOSPITAL_COMMUNITY): Payer: Self-pay

## 2022-07-05 ENCOUNTER — Other Ambulatory Visit: Payer: Self-pay

## 2022-07-05 ENCOUNTER — Emergency Department (HOSPITAL_COMMUNITY)
Admission: EM | Admit: 2022-07-05 | Discharge: 2022-07-05 | Disposition: A | Discharge status: Home / Self Care | Payer: 59 | Attending: Emergency Medicine | Admitting: Emergency Medicine

## 2022-07-05 DIAGNOSIS — M79605 Pain in left leg: Secondary | ICD-10-CM | POA: Diagnosis present

## 2022-07-05 DIAGNOSIS — M7989 Other specified soft tissue disorders: Secondary | ICD-10-CM

## 2022-07-05 DIAGNOSIS — M79662 Pain in left lower leg: Secondary | ICD-10-CM

## 2022-07-05 LAB — I-STAT CHEM 8, ED
BUN: 9 mg/dL (ref 8–23)
Calcium, Ion: 1.23 mmol/L (ref 1.15–1.40)
Chloride: 104 mmol/L (ref 98–111)
Creatinine, Ser: 1.4 mg/dL — ABNORMAL HIGH (ref 0.61–1.24)
Glucose, Bld: 94 mg/dL (ref 70–99)
HCT: 47 % (ref 39.0–52.0)
Hemoglobin: 16 g/dL (ref 13.0–17.0)
Potassium: 4 mmol/L (ref 3.5–5.1)
Sodium: 142 mmol/L (ref 135–145)
TCO2: 25 mmol/L (ref 22–32)

## 2022-07-05 MED ORDER — DOXYCYCLINE HYCLATE 100 MG PO TABS: 100.0000 mg | ORAL_TABLET | Freq: Two times a day (BID) | ORAL | 0 refills | Status: AC

## 2022-07-05 MED ORDER — ENOXAPARIN SODIUM 80 MG/0.8ML IJ SOSY
80.0000 mg | PREFILLED_SYRINGE | Freq: Once | INTRAMUSCULAR | Status: AC
  Administered 2022-07-05: 80 mg via SUBCUTANEOUS
  Filled 2022-07-05: qty 0.8

## 2022-07-05 NOTE — ED Notes (Signed)
ED Provider at bedside. 

## 2022-07-05 NOTE — Discharge Instructions (Signed)
You are scheduled to return here tomorrow for an ultrasound of your left leg.  You have been given a injection of a blood thinning medication today.  Be careful to avoid falls or cutting yourself.  Elevate your left leg tonight.  You may take Tylenol if needed for pain.  Please call the scheduling department in the morning at [phone] to arrange an appointment time for you to have your ultrasound.

## 2022-07-05 NOTE — ED Notes (Signed)
Patient is being discharged from the Urgent Care and sent to the Emergency Department via private vehicle . Per NP, patient is in need of higher level of care due to rule out blood clot. Patient is aware and verbalizes understanding of plan of care.  Vitals:   07/05/22 1026  BP: (!) 150/88  Pulse: 97  Resp: 20  Temp: 98.2 F (36.8 C)  SpO2: 97%

## 2022-07-05 NOTE — ED Triage Notes (Signed)
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

## 2022-07-05 NOTE — ED Triage Notes (Signed)
Pt reports a cyst on his left calf x 2 days. Pt reports it hurst worse with prolonged sitting.

## 2022-07-05 NOTE — ED Provider Notes (Addendum)
[facility]    CSN: [id number] Arrival date & time: 07/05/22  8315      History   Chief Complaint No chief complaint on file.   HPI Nathaniel Park is a 67 y.o. male.   The history is provided by the patient.   The patient presents with left calf pain that has been present for the past 2 days.  Patient states that he thinks it is "a cyst, or an abscess".  He states the area has become larger over the last 2 days.  He states that the pain worsens with prolonged sitting.  He denies any recent travel.  Patient also denies fever, chills, chest pain, shortness of breath, or difficulty breathing.  Patient states that the area is tender to touch as well.  He is not taking any medication for his symptoms.  Past Medical History:  Diagnosis Date   Crush injury    left index finger   Depression    Schizophrenia [facility])     Patient Active Problem List   Diagnosis Date Noted   Schizophrenia ([place]) 04/15/2018    Past Surgical History:  Procedure Laterality Date   AMPUTATION Left 08/09/2020   Procedure: AMPUTATION LEFT INDEX FINGER;  Surgeon: Leanora Cover, MD;  Location: [place];  Service: Orthopedics;  Laterality: Left;  60 MINUTES   EXTERNAL FIXATION OF FINGER Left 05/09/2020   Procedure: EXTERNAL FIXATION OF FINGER;  Surgeon: Leanora Cover, MD;  Location: [place];  Service: Orthopedics;  Laterality: Left;   EXTERNAL FIXATION REMOVAL Left 07/01/2020   Procedure: REMOVAL EXTERNAL FIXATOR LEFT INDEX FINGER;  Surgeon: Leanora Cover, MD;  Location: [place];  Service: Orthopedics;  Laterality: Left;  REMOVAL EXTERNAL FIXATOR LEFT INDEX FINGER 30 MINUTES   I & D EXTREMITY Left 05/09/2020   Procedure: IRRIGATION AND DEBRIDEMENT left index finger;  Surgeon: Leanora Cover, MD;  Location: [place];  Service: Orthopedics;  Laterality: Left;   INCISE AND DRAIN ABCESS         Home Medications    Prior to Admission medications   Medication Sig Start Date End Date  Taking? Authorizing Provider  doxycycline (VIBRA-TABS) 100 MG tablet Take 1 tablet (100 mg total) by mouth 2 (two) times daily. 07/05/22  Yes Kennet Mccort-Warren, Alda Lea, NP  HYDROcodone-acetaminophen (NORCO) 5-325 MG tablet 1-2 tabs po q6 hours prn pain 08/09/20   Leanora Cover, MD  ibuprofen (ADVIL) 800 MG tablet Take 1 tablet (800 mg total) by mouth every 8 (eight) hours as needed. 07/01/20   Leanora Cover, MD  ondansetron (ZOFRAN-ODT) 4 MG disintegrating tablet Take 1 tablet (4 mg total) by mouth every 8 (eight) hours as needed for nausea or vomiting. 05/14/21   Vanessa Kick, MD  perphenazine (TRILAFON) 16 MG tablet Take 1 tablet (16 mg total) by mouth at bedtime. For mood control 04/19/18   Encarnacion Slates, NP    Family History History reviewed. No pertinent family history.  Social History Social History   Tobacco Use   Smoking status: Every Day    Packs/day: 1.00    Types: Cigarettes   Smokeless tobacco: Never  Vaping Use   Vaping Use: Never used  Substance Use Topics   Alcohol use: Not Currently   Drug use: Not Currently     Allergies   Patient has no known allergies.   Review of Systems Review of Systems Per HPI  Physical Exam Triage Vital Signs ED Triage Vitals  Enc Vitals Group  BP 07/05/22 1026 (!) 150/88     Pulse Rate 07/05/22 1026 97     Resp 07/05/22 1026 20     Temp 07/05/22 1026 98.2 F (36.8 C)     Temp Source 07/05/22 1026 Oral     SpO2 07/05/22 1026 97 %     Weight --      Height --      Head Circumference --      Peak Flow --      Pain Score 07/05/22 1030 5     Pain Loc --      Pain Edu? --      Excl. in [place]? --    No data found.  Updated Vital Signs BP (!) 150/88 (BP Location: Right Arm)   Pulse 97   Temp 98.2 F (36.8 C) (Oral)   Resp 20   SpO2 97%   Visual Acuity Right Eye Distance:   Left Eye Distance:   Bilateral Distance:    Right Eye Near:   Left Eye Near:    Bilateral Near:     Physical Exam Vitals and nursing note  reviewed.  Constitutional:      General: He is not in acute distress.    Appearance: Normal appearance.  HENT:     Head: Normocephalic.  Cardiovascular:     Rate and Rhythm: Normal rate and regular rhythm.     Pulses: Normal pulses.     Heart sounds: Normal heart sounds.  Pulmonary:     Effort: Pulmonary effort is normal.     Breath sounds: Normal breath sounds.  Musculoskeletal:     Cervical back: Normal range of motion.     Left lower leg: Tenderness present. No deformity. No edema.     Comments: Induration noted to the posterior left calf.  Induration measures approximately 7 cm in diameter.  Area is warm to palpation and tender.  There is no oozing, fluctuance, or drainage present. Negative Homan's sign.  Patient with numerous Kos veins present in the bilateral lower extremities.  Lymphadenopathy:     Cervical: No cervical adenopathy.  Neurological:     General: No focal deficit present.     Mental Status: He is alert and oriented to person, place, and time.  Psychiatric:        Mood and Affect: Mood normal.        Behavior: Behavior normal.      UC Treatments / Results  Labs (all labs ordered are listed, but only abnormal results are displayed) Labs Reviewed - No data to display  EKG   Radiology No results found.  Procedures Procedures (including critical care time)  Medications Ordered in UC Medications - No data to display  Initial Impression / Assessment and Plan / UC Course  I have reviewed the triage vital signs and the nursing notes.  Pertinent labs & imaging results that were available during my care of the patient were reviewed by me and considered in my medical decision making (see chart for details).   The patient is well-appearing, he is in no acute distress, he is mildly hypertensive, but vital signs are otherwise stable.  Patient does not display any chest pain, shortness of breath, or difficulty breathing.  Suspect cellulitis of the left calf.   Cannot confirm without further imaging.  Patient was offered outpatient ultrasound, which was ordered for him to have an ultrasound later this week.  Patient was also given the option of going to the emergency department for  further evaluation.  In the interim, doxycycline 100 mg was ordered in the event the patient's swelling is due to cellulitis or an abscess.  Patient is going to the emergency department at this time.  Patient is stable and able to travel via private vehicle. Final Clinical Impressions(s) / UC Diagnoses   Final diagnoses:  Pain of left calf  Swelling of calf     Discharge Instructions      Cannot rule out a blood clot in the left calf out imaging.  Patient was given the option of having an outpatient ultrasound, or went to the emergency department for further evaluation.  Patient elected to go to the emergency department.  Patient to travel via private vehicle.     ED Prescriptions     Medication Sig Dispense Auth. Provider   doxycycline (VIBRA-TABS) 100 MG tablet Take 1 tablet (100 mg total) by mouth 2 (two) times daily. 20 tablet Alaijah Gibler-Warren, Alda Lea, NP      PDMP not reviewed this encounter.   Tish Men, NP 07/05/22 1109    Tish Men, NP 07/05/22 1115

## 2022-07-05 NOTE — Discharge Instructions (Addendum)
Cannot rule out a blood clot in the left calf out imaging.  Patient was given the option of having an outpatient ultrasound, or went to the emergency department for further evaluation.  Patient elected to go to the emergency department.  Patient to travel via private vehicle.

## 2022-07-06 ENCOUNTER — Ambulatory Visit (HOSPITAL_COMMUNITY)
Admission: RE | Admit: 2022-07-06 | Discharge: 2022-07-06 | Disposition: A | Discharge status: Home / Self Care | Payer: 59 | Source: Ambulatory Visit | Attending: Nurse Practitioner | Admitting: Nurse Practitioner

## 2022-07-06 DIAGNOSIS — I8002 Phlebitis and thrombophlebitis of superficial vessels of left lower extremity: Secondary | ICD-10-CM | POA: Diagnosis not present

## 2022-07-06 DIAGNOSIS — M79662 Pain in left lower leg: Secondary | ICD-10-CM | POA: Insufficient documentation

## 2022-07-06 NOTE — ED Provider Notes (Signed)
[facility] Provider Note   CSN: [id number] Arrival date & time: 07/05/22  1130     History  Chief Complaint  Patient presents with   Leg Pain    Nathaniel Park is a 67 y.o. male.   Leg Pain Associated symptoms: no fever        Nathaniel Park is a 67 y.o. male who presents to the Emergency Department sent from urgent care for evaluation of pain, redness of the left lower leg.  Noticed his symptoms 2 days ago.  He went to [facility] and sent here for possible DVT.  He is concerned that he has a "cyst" of his leg.  He denies injury.  Pain worse with standing and walking.  No history of DVT or PE.  He also denies chest pain or shortness of breath,  Home Medications Prior to Admission medications   Medication Sig Start Date End Date Taking? Authorizing Provider  doxycycline (VIBRA-TABS) 100 MG tablet Take 1 tablet (100 mg total) by mouth 2 (two) times daily. 07/05/22   Leath-Warren, Alda Lea, NP  HYDROcodone-acetaminophen (NORCO) 5-325 MG tablet 1-2 tabs po q6 hours prn pain 08/09/20   Leanora Cover, MD  ibuprofen (ADVIL) 800 MG tablet Take 1 tablet (800 mg total) by mouth every 8 (eight) hours as needed. 07/01/20   Leanora Cover, MD  ondansetron (ZOFRAN-ODT) 4 MG disintegrating tablet Take 1 tablet (4 mg total) by mouth every 8 (eight) hours as needed for nausea or vomiting. 05/14/21   Vanessa Kick, MD  perphenazine (TRILAFON) 16 MG tablet Take 1 tablet (16 mg total) by mouth at bedtime. For mood control 04/19/18   Encarnacion Slates, NP      Allergies    Patient has no known allergies.    Review of Systems   Review of Systems  Constitutional:  Negative for chills and fever.  Respiratory:  Negative for chest tightness, shortness of breath and wheezing.   Cardiovascular:  Negative for chest pain.  Gastrointestinal:  Negative for abdominal pain, nausea and vomiting.  Genitourinary:  Negative for dysuria.  Musculoskeletal:  Positive for myalgias (left  lower leg pain).  Skin:  Positive for color change. Negative for wound.  Neurological:  Negative for dizziness, weakness, light-headedness and numbness.    Physical Exam Updated Vital Signs BP (!) 155/98 (BP Location: Right Arm)   Pulse 65   Temp 98.1 F (36.7 C) (Oral)   Resp 16   Ht 6' (1.829 m)   Wt 83 kg   SpO2 98%   BMI 24.82 kg/m  Physical Exam Vitals and nursing note reviewed.  Constitutional:      General: He is not in acute distress.    Appearance: Normal appearance. He is not ill-appearing or toxic-appearing.  Cardiovascular:     Rate and Rhythm: Normal rate and regular rhythm.     Pulses: Normal pulses.  Pulmonary:     Effort: Pulmonary effort is normal.  Chest:     Chest wall: No tenderness.  Musculoskeletal:        General: Swelling and tenderness present. No signs of injury.     Comments: Localized ttp of the left posterior lower leg.  There is mild localized induration and erythema.  Pt has varicosities of bilateral lower legs.  Indurated, tender area follows a vein.    Skin:    General: Skin is warm.     Capillary Refill: Capillary refill takes less than 2 seconds.  Findings: Erythema present.  Neurological:     General: No focal deficit present.     Mental Status: He is alert.     Sensory: No sensory deficit.     Motor: No weakness.     ED Results / Procedures / Treatments   Labs (all labs ordered are listed, but only abnormal results are displayed) Labs Reviewed  I-STAT CHEM 8, ED - Abnormal; Notable for the following components:      Result Value   Creatinine, Ser 1.40 (*)    All other components within normal limits    EKG None  Radiology US Venous Img Lower Unilateral Left (DVT)  Result Date: 07/06/2022 CLINICAL DATA:  Left calf pain EXAM: LEFT LOWER EXTREMITY VENOUS DOPPLER ULTRASOUND TECHNIQUE: Gray-scale sonography with compression, as well as color and duplex ultrasound, were performed to evaluate the deep venous system(s) from  the level of the common femoral vein through the popliteal and proximal calf veins. COMPARISON:  None available FINDINGS: VENOUS Dilated thrombosed varicose vein noted in the calf. Left lower extremity veins otherwise demonstrate normal compressibility. No filling defects to suggest DVT on grayscale or color Doppler imaging. Doppler waveforms within the deep venous system show normal direction of venous flow, normal respiratory plasticity and response to augmentation. Limited views of the contralateral common femoral vein are unremarkable. OTHER None. Limitations: none IMPRESSION: 1. No left lower extremity DVT. 2. Thrombosed dilated varicose veins in the left calf consistent with superficial thrombophlebitis. Electronically Signed   By: Miachel Roux M.D.   On: 07/06/2022 09:49    Procedures Procedures    Medications Ordered in ED Medications  enoxaparin (LOVENOX) injection 80 mg (80 mg Subcutaneous Given 07/05/22 1416)    ED Course/ Medical Decision Making/ A&P                             Medical Decision Making Pt here from UC for eval of possible DVT of LLE.  No hx of PE or DVT.  He has varcosities of bilateral lower legs.  Localized erythema and induration of posterior left calf.    DP and PT pulses intact.  Compartment soft.  Extremity is warm and he has good cap refill  The tender, indurated area of the LLE follows a vein.  I suspect this is related to superficial thrombophlebitis.  Cellulitis, abscess and DVT also considered but felt less likely    On review of pt's medical record and specifically his recent UC visit,  pt has a stat venous US ordered from UC.  He was also prescribed doxycycline  which he has not yet filled  Amount and/or Complexity of Data Reviewed Labs: ordered.    Details: Creat 1.40 Discussion of management or test interpretation with external provider(s): Pt here with suspected superficial thrombophlebitis of LLE.  Unfortunately, [place] not available here in ED on  weekend.  Doubt emergent process, Pt agreeable to return here in am to US done and I will give [place] dose of Lovenox here.  I have advised to hold abx until [place] results.  He is agreeable to elevate, warm compresses to the extremity.  Has mildly elevated creatinine, so will refrain from NSAID use.    Risk Prescription drug management.           Final Clinical Impression(s) / ED Diagnoses Final diagnoses:  Left leg pain    Rx / DC Orders ED Discharge Orders     None  Kem Parkinson, PA-C 07/06/22 1507    Godfrey Pick, MD 07/08/22 [phone]

## 2024-07-03 ENCOUNTER — Ambulatory Visit
Admission: EM | Admit: 2024-07-03 | Discharge: 2024-07-03 | Disposition: A | Discharge status: Home / Self Care | Source: Home / Self Care

## 2024-07-03 DIAGNOSIS — J069 Acute upper respiratory infection, unspecified: Secondary | ICD-10-CM | POA: Diagnosis not present

## 2024-07-03 MED ORDER — AZELASTINE HCL 0.1 % NA SOLN: 1.0000 | Freq: Two times a day (BID) | NASAL | 0 refills | Status: AC

## 2024-07-03 MED ORDER — PROMETHAZINE-DM 6.25-15 MG/5ML PO SYRP: 5.0000 mL | ORAL_SOLUTION | Freq: Four times a day (QID) | ORAL | 0 refills | Status: AC | PRN

## 2024-07-03 NOTE — ED Provider Notes (Signed)
 " [facility]    CSN: [id number] Arrival date & time: 07/03/24  1638      History   Chief Complaint No chief complaint on file.   HPI Nathaniel Park is a 69 y.o. male.   Pt reports cough, congestion headache, sore throat x started some time last week.  Patient presenting today with    Past Medical History:  Diagnosis Date   Crush injury    left index finger   Depression    Schizophrenia [facility])     Patient Active Problem List   Diagnosis Date Noted   Schizophrenia (HCC) 04/15/2018    Past Surgical History:  Procedure Laterality Date   AMPUTATION Left 08/09/2020   Procedure: AMPUTATION LEFT INDEX FINGER;  Surgeon: Murrell Drivers, MD;  Location: [facility];  Service: Orthopedics;  Laterality: Left;  60 MINUTES   EXTERNAL FIXATION OF FINGER Left 05/09/2020   Procedure: EXTERNAL FIXATION OF FINGER;  Surgeon: Murrell Drivers, MD;  Location: [facility];  Service: Orthopedics;  Laterality: Left;   EXTERNAL FIXATION REMOVAL Left 07/01/2020   Procedure: REMOVAL EXTERNAL FIXATOR LEFT INDEX FINGER;  Surgeon: Murrell Drivers, MD;  Location: [facility];  Service: Orthopedics;  Laterality: Left;  REMOVAL EXTERNAL FIXATOR LEFT INDEX FINGER 30 MINUTES   I & D EXTREMITY Left 05/09/2020   Procedure: IRRIGATION AND DEBRIDEMENT left index finger;  Surgeon: Murrell Drivers, MD;  Location: [facility];  Service: Orthopedics;  Laterality: Left;   INCISE AND DRAIN ABCESS         Home Medications    Prior to Admission medications  Medication Sig Start Date End Date Taking? Authorizing Provider  doxycycline  (VIBRA -TABS) 100 MG tablet Take 1 tablet (100 mg total) by mouth 2 (two) times daily. 07/05/22   Leath-Warren, Etta PARAS, NP  HYDROcodone -acetaminophen  (NORCO) 5-325 MG tablet 1-2 tabs po q6 hours prn pain 08/09/20   Kuzma, Kevin, MD  ibuprofen  (ADVIL ) 800 MG tablet Take 1 tablet (800 mg total) by mouth every 8 (eight) hours as needed. 07/01/20   Kuzma, Kevin, MD  ondansetron   (ZOFRAN -ODT) 4 MG disintegrating tablet Take 1 tablet (4 mg total) by mouth every 8 (eight) hours as needed for nausea or vomiting. 05/14/21   Rolinda Rogue, MD  perphenazine  (TRILAFON ) 16 MG tablet Take 1 tablet (16 mg total) by mouth at bedtime. For mood control 04/19/18   Collene Mac FERNS, NP    Family History History reviewed. No pertinent family history.  Social History Social History[1]   Allergies   Patient has no known allergies.   Review of Systems Review of Systems   Physical Exam Triage Vital Signs ED Triage Vitals  Encounter Vitals Group     BP 07/03/24 1643 (!) 157/82     Girls Systolic BP Percentile --      Girls Diastolic BP Percentile --      Boys Systolic BP Percentile --      Boys Diastolic BP Percentile --      Pulse Rate 07/03/24 1643 (!) 107     Resp 07/03/24 1643 18     Temp 07/03/24 1643 97.9 F (36.6 C)     Temp Source 07/03/24 1643 Oral     SpO2 07/03/24 1643 96 %     Weight --      Height --      Head Circumference --      Peak Flow --      Pain Score 07/03/24 1644 0  Pain Loc --      Pain Education --      Exclude from Growth Chart --    No data found.  Updated Vital Signs BP (!) 157/82 (BP Location: Right Arm)   Pulse (!) 107   Temp 97.9 F (36.6 C) (Oral)   Resp 18   SpO2 96%   Visual Acuity Right Eye Distance:   Left Eye Distance:   Bilateral Distance:    Right Eye Near:   Left Eye Near:    Bilateral Near:     Physical Exam   UC Treatments / Results  Labs (all labs ordered are listed, but only abnormal results are displayed) Labs Reviewed - No data to display  EKG   Radiology No results found.  Procedures Procedures (including critical care time)  Medications Ordered in UC Medications - No data to display  Initial Impression / Assessment and Plan / UC Course  I have reviewed the triage vital signs and the nursing notes.  Pertinent labs & imaging results that were available during my care of the patient  were reviewed by me and considered in my medical decision making (see chart for details).     *** Final Clinical Impressions(s) / UC Diagnoses   Final diagnoses:  None   Discharge Instructions   None    ED Prescriptions   None    PDMP not reviewed this encounter.    [1]  Social History Tobacco Use   Smoking status: Every Day    Current packs/day: 1.00    Types: Cigarettes   Smokeless tobacco: Never  Vaping Use   Vaping status: Never Used  Substance Use Topics   Alcohol use: Not Currently    Comment: once a week   Drug use: Not Currently   "

## 2024-07-03 NOTE — Discharge Instructions (Signed)
 In addition to the prescribed medications, you may use over-the-counter remedies such as Coricidin HBP, plain Mucinex, Tylenol , saline sinus rinses, humidifiers.  Drink plenty of fluids, get lots of rest.  Follow-up for significant worsening symptoms
